# Patient Record
Sex: Male | Born: 1950 | Race: White | Hispanic: No | Marital: Married | Smoking: Former smoker
Health system: Southern US, Community
[De-identification: ages and names within clinical notes are randomized; demographics above are authoritative.]

## PROBLEM LIST (undated history)

## (undated) DIAGNOSIS — I499 Cardiac arrhythmia, unspecified: Secondary | ICD-10-CM

## (undated) DIAGNOSIS — Z923 Personal history of irradiation: Secondary | ICD-10-CM

## (undated) DIAGNOSIS — G20 Parkinson's disease: Secondary | ICD-10-CM

## (undated) DIAGNOSIS — F0390 Unspecified dementia without behavioral disturbance: Secondary | ICD-10-CM

## (undated) DIAGNOSIS — C801 Malignant (primary) neoplasm, unspecified: Secondary | ICD-10-CM

## (undated) HISTORY — PX: PEG PLACEMENT: SHX5437

## (undated) HISTORY — DX: Malignant (primary) neoplasm, unspecified: C80.1

## (undated) HISTORY — PX: BACK SURGERY: SHX140

---

## 2016-07-10 ENCOUNTER — Other Ambulatory Visit: Payer: Self-pay | Admitting: Hematology and Oncology

## 2016-07-11 ENCOUNTER — Telehealth: Payer: Self-pay | Admitting: *Deleted

## 2016-07-11 NOTE — Telephone Encounter (Signed)
  Oncology Nurse Navigator Documentation  Spoke with Mr. Riccitelli wife regarding referral from Dr. Conley Canal. She stated husband is pursuing chemo/RT in [place], [place], to begin later this month. Dr. Alvy Bimler informed.  Gayleen Orem, RN, BSN, [place] at [facility] [phone]   Navigator Location: [place] [phone]/07/17 1135) Navigator Encounter Type: Telephone (07/11/16 1135) Telephone: Jerilee Hoh Confirmation/Clarification (07/11/16 1135)             Barriers/Navigation Needs: Coordination of Care (07/11/16 1135)                          Time Spent with Patient: 15 (07/11/16 1135)

## 2016-07-16 ENCOUNTER — Telehealth: Payer: Self-pay | Admitting: *Deleted

## 2016-07-16 NOTE — Telephone Encounter (Signed)
Oncology Nurse Navigator Documentation  Spoke with Nathan Melton and his wife.  They have decided to investigate treatment at [facility] at this time rather than [place], [place], as previously indicated. I confirmed understanding of 9/20 9:00 NE, 9:30 consult with Dr. Isidore Moos; their understanding of [facility] location, explained arrival and registration procedures. I provided further information regarding my role as their Navigator, they understand I will join them at next week's appointment. I again provided my contact information, encouraged them to call me with questions prior to next week.  They voiced understanding.  Gayleen Orem, RN, BSN, [place] at [place] [phone]

## 2016-07-18 ENCOUNTER — Encounter: Payer: Self-pay | Admitting: Radiation Oncology

## 2016-07-18 NOTE — Progress Notes (Signed)
error 

## 2016-07-19 ENCOUNTER — Other Ambulatory Visit: Payer: Self-pay | Admitting: Hematology and Oncology

## 2016-07-19 DIAGNOSIS — C099 Malignant neoplasm of tonsil, unspecified: Secondary | ICD-10-CM

## 2016-07-19 NOTE — Progress Notes (Signed)
Radiation Oncology         [phone] ________________________________  Initial outpatient Consultation  Name: Nathan Melton MRN: [id number]  Date: 07/22/2016  DOB: 1951/10/29  [id number], DO  Fredricka Bonine, *   REFERRING PHYSICIAN: Fredricka Bonine, *  DIAGNOSIS:    ICD-9-CM ICD-10-CM   1. 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 male who presented with a four week history of a right neck mass with associated neck and ear pain.  Subsequently, the patient saw Dr. Conley Canal at [facility] who performed biopsy of right tonsil (tonsillectomy) on 06-25-16 revealed: small focus of squamous cell carcinoma, HPV type 16 positive   Pertinent imaging thus far includes CT of neck performed on 9.1.17 revealing a 3.3 cm right  glossotonsillar sulcus mass.  A right level 2A conglomerate of nodal tissue measured up to 4.7cm, as well as at least one, possibly more, suspicious right level 3 nodes (non bulky).  Same day PET revealed hypermetabolic activity in the right oropharyngeal mass and hypermetabolic lymph nodes posterior to the right sternocleidomastoid muscle concerning for nodal disease.No distant FDG avid metastatic disease in the chest, abdomen, pelvis.  Past/Anticipated interventions by medical oncology, if any: Dr. Alvy Bimler 07/22/16 at 12:00  Current Complaints / other details:   Dr. 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 I discussed this with his wife and recommended we do not proceed with surgery but rather set him up for chemoradiation therapy."  Swallowing issues, if any: uses pump for parkinson's meds, and has a peg to use prn (not yet needed) but denies new swallowing issues  r/t cancer  Weight Changes: denies  Pain status: denies  Tobacco history, if any: quit, distant  ETOH abuse, if any: no current ETOH     PREVIOUS RADIATION THERAPY: No  PAST MEDICAL HISTORY:  has a past medical history of Cancer ([place]); Dementia; and Parkinson's disease ([place]).    PAST SURGICAL HISTORY: Past Surgical History:  Procedure Laterality Date  . BACK SURGERY    . PEG PLACEMENT      FAMILY HISTORY: family history includes Cancer in his father, mother, and sister.  SOCIAL HISTORY:  reports that he quit smoking about 20 years ago. His smoking use included Cigarettes. He has a 30.00 pack-year smoking history. He has quit using smokeless tobacco. He reports that he does not drink alcohol.  ALLERGIES: Review of patient's allergies indicates no known allergies.  MEDICATIONS:  Current Outpatient Prescriptions  Medication Sig Dispense Refill  . ALPRAZolam (XANAX) 0.25 MG tablet Take one half to one whole tablet as needed for anxiety.    Marland Kitchen amitriptyline (ELAVIL) 75 MG tablet Take 75 mg by mouth.    . Carbidopa-Levodopa ER (SINEMET CR) 25-100 MG tablet controlled release Take by mouth. Take one tablet by mouth nightly    . docusate sodium (COLACE) 100 MG capsule Take 100 mg by mouth 2 (two) times daily.     Marland Kitchen donepezil (ARICEPT) 5 MG tablet Take 5 mg by mouth.    . Multiple Vitamins-Minerals (MULTIVITAMIN WITH MINERALS) tablet Take 1 tablet by mouth daily.    Marland Kitchen UNABLE TO FIND Med Name: carbidopa-levodopa pump continuous infusion for parkison's    .  CARBIDOPA-LEVODOPA EN by Enteral route.    . lactobacillus acidophilus (BACID) TABS tablet Take 2 tablets by mouth 3 (three) times daily.     No current facility-administered medications for this encounter.     REVIEW OF SYSTEMS:  Notable for that above.   PHYSICAL EXAM:  height is 5\' 9"  (1.753 m) and weight is 146 lb 4.8 oz (66.4 kg). His temperature is 98 F (36.7 C). His blood pressure is 125/88 and his pulse is 79. His  oxygen saturation is 100%.   General: Alert and oriented, in no acute distress HEENT: Head is normocephalic. Extraocular movements are intact. Edentulous. Oropharynx is notable for submucosal right tonsillar mass >2cm. Neck: Neck is notable for mass at angle of right mandible, and level 3 right neck fullness Heart: Regular in rate and rhythm   Chest: Clear to auscultation bilaterally, with no rhonchi, wheezes, or rales. Abdomen: Soft, nontender, nondistended, with no rigidity or guarding. PEG / pump in place  Extremities: No cyanosis or edema. Lymphatics: see Neck Exam Skin: No concerning lesions. Musculoskeletal: ambulatory, spasticity of extremities Neurologic: spasticity of extremities  Psychiatric: blunted affect, provides limited history  ECOG = 3  0 - Asymptomatic (Fully active, able to carry on all predisease activities without restriction)  1 - Symptomatic but completely ambulatory (Restricted in physically strenuous activity but ambulatory and able to carry out work of a light or sedentary nature. For example, light housework, office work)  2 - Symptomatic, <50% in bed during the day (Ambulatory and capable of all self care but unable to carry out any work activities. Up and about more than 50% of waking hours)  3 - Symptomatic, >50% in bed, but not bedbound (Capable of only limited self-care, confined to bed or chair 50% or more of waking hours)  4 - Bedbound (Completely disabled. Cannot carry on any self-care. Totally confined to bed or chair)  5 - Death   Eustace Pen MM, Creech RH, Tormey DC, et al. [phone]). "Toxicity and response criteria of the [facility]". [place] Oncol. 5 (6): 649-55   LABORATORY DATA:  Lab Results  Component Value Date   WBC 7.3 07/22/2016   HGB 13.6 07/22/2016   HCT 41.4 07/22/2016   MCV 92.1 07/22/2016   PLT 254 07/22/2016   CMP     Component Value Date/Time   NA 143 07/22/2016 0850   K 3.9 07/22/2016 0850   CO2 27  07/22/2016 0850   GLUCOSE 96 07/22/2016 0850   BUN 13.1 07/22/2016 0850   CREATININE 0.8 07/22/2016 0850   CALCIUM 9.4 07/22/2016 0850   PROT 7.4 07/22/2016 0850   ALBUMIN 3.4 (L) 07/22/2016 0850   AST 11 07/22/2016 0850   ALT <9 07/22/2016 0850   ALKPHOS 66 07/22/2016 0850   BILITOT 0.43 07/22/2016 0850     No results found for: TSH     RADIOGRAPHY: as above    IMPRESSION/PLAN:  This is a delightful patient with head and neck cancer. I do recommend radiotherapy for this patient.  We discussed the potential risks, benefits, and side effects of radiotherapy. We talked in detail about acute and late effects. We discussed that some of the most bothersome acute effects may be mucositis, dysgeusia, salivary changes, skin irritation, hair loss, dehydration, weight loss and fatigue. We talked about late effects which include but are not necessarily limited to dysphagia, hypothyroidism, nerve injury, spinal cord injury, xerostomia, trismus, and neck edema. No guarantees of treatment were given. A consent  form was signed and placed in the patient's medical record. The patient is enthusiastic about proceeding with treatment. I look forward to participating in the patient's care.    Simulation (treatment planning) will take place today  We also discussed that the treatment of head and neck cancer is a multidisciplinary process to maximize treatment outcomes and quality of life. For this reasons the following referrals have been or will be made:   Medical oncology to discuss chemotherapy    Nutritionist for nutrition support during and after treatment.   Speech language pathology for swallowing and/or speech therapy.   Social work for social support.    Physical therapy due to risk of lymphedema in neck and deconditioning.   Baseline labs including TSH. __________________________________________   Eppie Gibson, MD

## 2016-07-22 ENCOUNTER — Ambulatory Visit
Admission: RE | Admit: 2016-07-22 | Discharge: 2016-07-22 | Disposition: A | Discharge status: Home / Self Care | Payer: Medicare Other | Source: Ambulatory Visit | Attending: Radiation Oncology | Admitting: Radiation Oncology

## 2016-07-22 ENCOUNTER — Encounter: Payer: Self-pay | Admitting: Radiation Oncology

## 2016-07-22 ENCOUNTER — Other Ambulatory Visit (HOSPITAL_BASED_OUTPATIENT_CLINIC_OR_DEPARTMENT_OTHER): Payer: Medicare Other

## 2016-07-22 ENCOUNTER — Ambulatory Visit (HOSPITAL_BASED_OUTPATIENT_CLINIC_OR_DEPARTMENT_OTHER): Payer: Medicare Other | Admitting: Hematology and Oncology

## 2016-07-22 ENCOUNTER — Encounter: Payer: Self-pay | Admitting: *Deleted

## 2016-07-22 ENCOUNTER — Encounter: Payer: Self-pay | Admitting: Hematology and Oncology

## 2016-07-22 DIAGNOSIS — C090 Malignant neoplasm of tonsillar fossa: Secondary | ICD-10-CM | POA: Diagnosis present

## 2016-07-22 DIAGNOSIS — G20 Parkinson's disease: Secondary | ICD-10-CM | POA: Diagnosis not present

## 2016-07-22 DIAGNOSIS — Z510 Encounter for antineoplastic radiation therapy: Secondary | ICD-10-CM | POA: Diagnosis not present

## 2016-07-22 DIAGNOSIS — F0390 Unspecified dementia without behavioral disturbance: Secondary | ICD-10-CM | POA: Diagnosis not present

## 2016-07-22 DIAGNOSIS — C099 Malignant neoplasm of tonsil, unspecified: Secondary | ICD-10-CM

## 2016-07-22 DIAGNOSIS — F0280 Dementia in other diseases classified elsewhere without behavioral disturbance: Secondary | ICD-10-CM | POA: Insufficient documentation

## 2016-07-22 DIAGNOSIS — G20A1 Parkinson's disease without dyskinesia, without mention of fluctuations: Secondary | ICD-10-CM | POA: Insufficient documentation

## 2016-07-22 LAB — COMPREHENSIVE METABOLIC PANEL
ALK PHOS: 66 U/L (ref 40–150)
AST: 11 U/L (ref 5–34)
Albumin: 3.4 g/dL — ABNORMAL LOW (ref 3.5–5.0)
Anion Gap: 8 mEq/L (ref 3–11)
BUN: 13.1 mg/dL (ref 7.0–26.0)
CHLORIDE: 108 meq/L (ref 98–109)
CO2: 27 mEq/L (ref 22–29)
CREATININE: 0.8 mg/dL (ref 0.7–1.3)
Calcium: 9.4 mg/dL (ref 8.4–10.4)
EGFR: >90 mL/min/{1.73_m2} (ref >90)
GLUCOSE: 96 mg/dL (ref 70–140)
POTASSIUM: 3.9 meq/L (ref 3.5–5.1)
Sodium: 143 mEq/L (ref 136–145)
TOTAL PROTEIN: 7.4 g/dL (ref 6.4–8.3)
Total Bilirubin: 0.43 mg/dL (ref 0.20–1.20)

## 2016-07-22 LAB — CBC WITH DIFFERENTIAL/PLATELET
BASO%: 0.6 % (ref 0.0–2.0)
Basophils Absolute: 0 10*3/uL (ref 0.0–0.1)
EOS%: 2 % (ref 0.0–7.0)
Eosinophils Absolute: 0.1 10*3/uL (ref 0.0–0.5)
HCT: 41.4 % (ref 38.4–49.9)
HEMOGLOBIN: 13.6 g/dL (ref 13.0–17.1)
LYMPH#: 1.4 10*3/uL (ref 0.9–3.3)
LYMPH%: 18.9 % (ref 14.0–49.0)
MCH: 30.3 pg (ref 27.2–33.4)
MCHC: 32.9 g/dL (ref 32.0–36.0)
MCV: 92.1 fL (ref 79.3–98.0)
MONO#: 0.7 10*3/uL (ref 0.1–0.9)
MONO%: 9.2 % (ref 0.0–14.0)
NEUT%: 69.3 % (ref 39.0–75.0)
NEUTROS ABS: 5.1 10*3/uL (ref 1.5–6.5)
PLATELETS: 254 10*3/uL (ref 140–400)
RBC: 4.5 10*6/uL (ref 4.20–5.82)
RDW: 15.3 % — AB (ref 11.0–14.6)
WBC: 7.3 10*3/uL (ref 4.0–10.3)

## 2016-07-22 LAB — MAGNESIUM: Magnesium: 2.3 mg/dl (ref 1.5–2.5)

## 2016-07-22 MED ORDER — SODIUM CHLORIDE 0.9% FLUSH
10.0000 mL | Freq: Once | INTRAVENOUS | Status: AC
  Administered 2016-07-22: 10 mL via INTRAVENOUS

## 2016-07-22 NOTE — Progress Notes (Signed)
Head and Neck Cancer Simulation, IMRT treatment planning note   outpatient  Diagnosis: C09.0 Tonsillar Fossa Cancer   The patient was taken to the CT simulator and laid in the supine position on the table. An Aquaplast head and shoulder mask was custom fitted to the patient's anatomy. High-resolution CT axial imaging was obtained of the head and neck with contrast. I verified that the quality of the imaging is good for treatment planning. 1 Medically Necessary Treatment Device was fabricated and supervised by me: Aquaplast mask.   Treatment planning note I plan to treat the patient with IMRT. I plan to treat the patient's tumor and bilateral neck nodes. I plan to treat to a total dose of 70 Gray in 35  fractions. Dose calculation was ordered from dosimetry.  IMRT planning Note  IMRT is an important modality to deliver adequate dose to the patient's at risk tissues while sparing the patient's normal structures, including the: esophagus, parotid tissue, mandible, brain stem, spinal cord, oral cavity, brachial plexus.  This justifies the use of IMRT in the patient's treatment.    -----------------------------------  Eppie Gibson, MD

## 2016-07-22 NOTE — Assessment & Plan Note (Signed)
The patient has very advanced Parkinson's disease. He is on some form of continuous pump for medication delivery I would defer to his neurologist for further management

## 2016-07-22 NOTE — Progress Notes (Signed)
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 male is here because of newly diagnosed right tonsil cancer. Majority of the history is obtained through review of electronic records and collaborated history with his wife. According to the patient, the first initial presentation was due to palpable lump on the right side of his neck since May 2017. This patient also has background history of advanced Parkinson's disease, on a continuous infusion pump for medication administration through a feeding tube. His Parkinson's disease is also associated with dementia. He underwent extensive evaluation and was subsequently diagnosed with tonsil cancer. 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 Abnormal enhancing tissue abuts the right tongue base. Large enhancing and necrotic level 2A nodal conglomerate/mass measuring 4.7 x 2.7 x 3.3 cm, AP by  transverse by CC, corresponding to the patient's hypermetabolic lesion on contemporaneous PET imaging. The mass invades the right carotid space with abnormal tissue encasing and narrowing the cervical right internal carotid artery with abnormal enhancing tissue extending to the right internal carotid artery; the artery remains grossly patent.The right external carotid artery is encased and narrowed by the enhancing mass, however, appears to remain patent. The right internal jugular vein is compressed and occluded at the level of the mass. There is a long segment nonocclusive filling defect of the left internal jugular vein inferior to the mass concerning for nonocclusive thrombus within both benign and malignant thrombus in the differential. There is a central filling defect in the right EJ as well. Prominent rounded 9 mm right level 3 lymph node concerning for an additional site of metastatic disease as the node displays increased metabolic activity on contemporaneous PET imaging      07/05/2016 PET scan    Large hypermetabolic mass in the right neck as described above, concerning for malignancy. There is a large area of asymmetric and hypermetabolic soft tissue involving the right neck, which spans anteriorly involving the right palatine tonsil and posteriorly to the right sternocleidomastoid muscle, and measures approximately 6.3 x 4.3 cm. There is also asymmetric thickening and hypermetabolic activity within the right palatine tonsil. There are 2 adjacent lymph nodes, posterior to the right sternocleidomastoid muscle, which measure approximately 6 to 7 mm in size and demonstrate malignant range uptake of FDG. Ancillary head and neck CT findings: Intracranial atherosclerosis.Given asymmetric soft tissue thickening of the right palatine tonsil, this large mass could represent a primary lesion versus nodal disease. There are hypermetabolic lymph nodes posterior to the right sternocleidomastoid muscle concerning  for nodal disease. No distant FDG avid metastatic disease in the chest, abdomen, pelvis.       he denies any hearing deficit, difficulties with chewing food, swallowing difficulties, painful swallowing, changes in the quality of his voice or abnormal weight loss. He has persistent sedation from the right side of the neck causing discomfort but not pain.  MEDICAL HISTORY:  Past Medical History:  Diagnosis Date  . Cancer ([place])   . Dementia   . Parkinson's disease ([place])     SURGICAL HISTORY: Past Surgical History:  Procedure Laterality Date  . BACK SURGERY    . PEG PLACEMENT      SOCIAL HISTORY: Social History   Social History  . Marital status: Married    Spouse name: [place]  . Number of children: 4  . Years of education: N/A   Occupational History  . painter    Social History Main Topics  . Smoking status: Former Smoker    Packs/day: 2.00    Years: 15.00    Types: Cigarettes    Quit date: 11/05/1995  . Smokeless tobacco: Former Systems developer  . Alcohol use No  . Drug use: Unknown  . Sexual activity: Not on file   Other Topics Concern  . Not on file   Social History Narrative  . No narrative on file    FAMILY HISTORY: Family History  Problem Relation Age of Onset  . Cancer Mother     skin ca  . Cancer Father     lung ca  . Cancer Sister     breast ca    ALLERGIES:  has No Known Allergies.  MEDICATIONS:  Current Outpatient Prescriptions  Medication Sig Dispense Refill  . ALPRAZolam (XANAX) 0.25 MG tablet Take one half to one whole tablet as needed for anxiety.    Marland Kitchen amitriptyline (ELAVIL) 75 MG tablet Take 75 mg by mouth.    . CARBIDOPA-LEVODOPA EN by Enteral route.    . Carbidopa-Levodopa ER (SINEMET CR) 25-100 MG tablet controlled release Take by mouth. Take one tablet by mouth nightly    . docusate sodium (COLACE) 100 MG capsule Take 100 mg by mouth 2 (two) times daily.     Marland Kitchen donepezil (ARICEPT) 5 MG tablet Take 5 mg by mouth.    . lactobacillus  acidophilus (BACID) TABS tablet Take 2 tablets by mouth 3 (three) times daily.    . Multiple Vitamins-Minerals (MULTIVITAMIN WITH MINERALS) tablet Take 1 tablet by mouth daily.    Marland Kitchen UNABLE TO FIND Med Name: carbidopa-levodopa pump continuous infusion for parkison's     No current facility-administered medications for this visit.     REVIEW OF SYSTEMS:   Constitutional: Denies fevers, chills or abnormal night sweats Eyes: Denies blurriness of vision, double vision or watery eyes Ears, nose, mouth, throat, and face: Denies mucositis or sore throat Respiratory: Denies cough, dyspnea or wheezes Cardiovascular: Denies palpitation, chest discomfort or lower extremity swelling Gastrointestinal:  Denies nausea, heartburn or change in bowel habits Skin: Denies abnormal skin rashes All other systems were reviewed with the patient and are negative.  PHYSICAL EXAMINATION: ECOG PERFORMANCE STATUS: 2 - Symptomatic, <50% confined to bed  Vitals:   07/22/16 1122  BP: (!) 123/98  Pulse: 81  Resp: 20  Temp: 97.9 F (36.6 C)   Filed Weights   07/22/16 1122  Weight: 146 lb 12.8 oz (66.6 kg)    GENERAL:alert, no distress and comfortable. He has obvious movement disorder and cannot sit still SKIN: skin color, texture,  turgor are normal, no rashes or significant lesions EYES: normal, conjunctiva are pink and non-injected, sclera clear OROPHARYNX:no exudate, no erythema and lips, buccal mucosa, and tongue normal . I am not able to appreciate any abnormalities in his oropharynx NECK: supple, thyroid normal size, non-tender, without nodularity LYMPH:  He has significant palpable lymphadenopathy on the right side of the neck LUNGS: clear to auscultation and percussion with normal breathing effort HEART: regular rate & rhythm and no murmurs and no lower extremity edema ABDOMEN:abdomen soft, non-tender and normal bowel sounds. Feeding tube in situ Musculoskeletal:no cyanosis of digits and no clubbing .  He has an infusion pump delivering medicine through a port in his abdominal wall PSYCH: alert & oriented with difficulties with speech disturbances NEURO: He has significant movement disorder and is not able to sit still.  LABORATORY DATA:  I have reviewed the data as listed Lab Results  Component Value Date   WBC 7.3 07/22/2016   HGB 13.6 07/22/2016   HCT 41.4 07/22/2016   MCV 92.1 07/22/2016   PLT 254 07/22/2016   Lab Results  Component Value Date   NA 143 07/22/2016   K 3.9 07/22/2016   CO2 27 07/22/2016    RADIOGRAPHIC STUDIES: Outside reports noted  ASSESSMENT:  Newly diagnosed squamous cell carcinoma of the Head & Neck, HPV Positive  PLAN:  Carcinoma of tonsillar fossa ([place]) I have a long discussion with the patient and his wife. The patient has significant comorbidities related to his Parkinson's disease. PET/CT scan showed locally advanced disease. In another different patient, I would have consider offering him concurrent systemic treatment. However, given his significant other comorbidities, I am concerned that the addition of concurrent chemotherapy may adversely effect his radiation treatment. After significant discussion about the risks, benefits, side effects of concurrent chemotherapy, his wife agreed that we should not proceed with concurrent treatment. I plan to see him back in 3 weeks for supportive care only.  Parkinson's disease [facility]) The patient has very advanced Parkinson's disease. He is on some form of continuous pump for medication delivery I would defer to his neurologist for further management  Dementia due to Parkinson's disease without behavioral disturbance [facility]) The patient is currently on treatment for this. According to his wife, she has seen some improvement of his dementia. He has occasional late day confusion episodes but without behavioral disturbances. I will continue to defer to his neurologist for further management    All  questions were answered. The patient knows to call the clinic with any problems, questions or concerns. I spent 40 minutes counseling the patient face to face. The total time spent in the appointment was 60 minutes and more than 50% was on counseling.     [facility], [place], MD 07/22/16 12:22 PM

## 2016-07-22 NOTE — Assessment & Plan Note (Signed)
The patient is currently on treatment for this. According to his wife, she has seen some improvement of his dementia. He has occasional late day confusion episodes but without behavioral disturbances. I will continue to defer to his neurologist for further management

## 2016-07-22 NOTE — Progress Notes (Addendum)
Head and Neck Cancer Location of Tumor / Histology:  06/25/16 OROPHARYNX, RIGHT TONSIL, TONSILLECTOMY: Small focus of invasive squamous cell carcinoma (see note).  Note: the HPV PCR test has been ordered. However the current material is very small and may not be sufficient. ADDENDUM Date Ordered: 9/1/2017Date Reported: 07/08/2016 Addendum Diagnosis HPV testing performed on block [place]:[id number] A1 (~5% tumor).  TestResults Reference Values HPVHR type 16, PCRPOSITIVENegative HPVHR type 18, PCRNegativeNegative HPV other HR types, PCR NegativeNegative (Negative for one of the other High Risk HPV types:31, 33, 35, 39, 45, 51, 52, 56, 58, 59, 66 and 68)  Patient presented  months ago with symptoms of: On 06/19/16 He presented to Dr. Conley Canal with a 4 week history of a Right Neck Mass. He reported the mass is painful and has some right sided ear pain and warmth.   Biopsies of  Revealed: Oropharynx, Right Tonsil revealed invasive squamous cell carcinoma.   Nutrition Status Yes No Comments  Weight changes? []  [x]    Swallowing concerns? []  [x]    PEG? [x]  []  He has a feeding tube which is also used to [employer] medicine.    Referrals Yes No Comments  Social Work? []  [x]    Dentistry? []  [x]    Swallowing therapy? []  [x]    Nutrition? []  [x]    Med/Onc? []  []  Dr. Alvy Bimler 9/18   Safety Issues Yes No Comments  Prior radiation? []  [x]    Pacemaker/ICD? []  [x]    Possible current pregnancy? []  [x]    Is the patient on methotrexate? []  [x]     Tobacco/Marijuana/Snuff/ETOH use: He quit smoking about 20 years ago. He reports he smoked 2 packs daily for 15 years.   Past/Anticipated interventions by otolaryngology, if any:  Dr. Conley Canal 06/25/16 PROCEDURES: 1. Suspension microdirect laryngoscopy. 2. Biopsy of right tonsil.  SURGEON: Christopher A. Conley Canal, M.D. ASSISTANT  SURGEON: Heath Lark, M.D.       Past/Anticipated interventions by medical oncology, if any: Dr. 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 I discussed this with his wife and recommended we do not proceed with surgery but rather set him up for chemoradiation therapy.  BP 125/88   Pulse 79   Temp 98 F (36.7 C)   Ht 5\' 9"  (1.753 m)   Wt 146 lb 4.8 oz (66.4 kg)   SpO2 100% Comment: room air  BMI 21.60 kg/m    Wt Readings from Last 3 Encounters:  07/22/16 146 lb 4.8 oz (66.4 kg)       No chief complaint on file.

## 2016-07-22 NOTE — Progress Notes (Addendum)
Does patient have an allergy to IV contrast dye?: No   Has patient ever received premedication for IV contrast dye?: No  Does patient take metformin?: No  If patient does take metformin when was the last dose: NA  Date of lab work: 07/22/16 BUN: 13.1 CR: 0.8  IV site: Left Anterior forearm.

## 2016-07-22 NOTE — Assessment & Plan Note (Signed)
I have a long discussion with the patient and his wife. The patient has significant comorbidities related to his Parkinson's disease. PET/CT scan showed locally advanced disease. In another different patient, I would have consider offering him concurrent systemic treatment. However, given his significant other comorbidities, I am concerned that the addition of concurrent chemotherapy may adversely effect his radiation treatment. After significant discussion about the risks, benefits, side effects of concurrent chemotherapy, his wife agreed that we should not proceed with concurrent treatment. I plan to see him back in 3 weeks for supportive care only.

## 2016-07-23 ENCOUNTER — Telehealth: Payer: Self-pay | Admitting: *Deleted

## 2016-07-23 ENCOUNTER — Other Ambulatory Visit: Payer: Self-pay | Admitting: Radiation Oncology

## 2016-07-23 DIAGNOSIS — C090 Malignant neoplasm of tonsillar fossa: Secondary | ICD-10-CM

## 2016-07-23 DIAGNOSIS — R5381 Other malaise: Secondary | ICD-10-CM

## 2016-07-23 NOTE — Telephone Encounter (Signed)
Oncology Nurse Navigator Documentation  Subsequent to this morning's call from [name] and her request that afternoon RT appointments be scheduled for the morning, she called to re-request afternoon appointments to accommodate her work schedule.  I rescheduled appts with RTT Melissa to the afternoon.  Called [name] to inform.  She voiced understanding and appreciation.  Nathan Orem, RN, BSN, [place] at [place] [phone]

## 2016-07-24 ENCOUNTER — Ambulatory Visit: Payer: Medicare Other

## 2016-07-24 ENCOUNTER — Ambulatory Visit
Admission: RE | Admit: 2016-07-24 | Discharge: 2016-07-24 | Disposition: A | Discharge status: Home / Self Care | Payer: Medicare Other | Source: Ambulatory Visit | Attending: Radiation Oncology | Admitting: Radiation Oncology

## 2016-07-24 HISTORY — DX: Parkinson's disease: G20

## 2016-07-24 HISTORY — DX: Unspecified dementia, unspecified severity, without behavioral disturbance, psychotic disturbance, mood disturbance, and anxiety: F03.90

## 2016-07-25 NOTE — Progress Notes (Signed)
Oncology Nurse Navigator Documentation  Met with Nathan Melton and his wife during initial consult with Dr. Alvy Bimler.    They voiced understanding of her recommendation chemotherapy not be included in his treatment plan.  We discussed his attendance at next Tuesday morning's H&N MDC, I provided them a 0900 arrival, explained registration and arrival procedures. I encouraged them to call with questions/concerns, they verbalized understanding.  Gayleen Orem, RN, BSN, [place] at [place] [phone]

## 2016-07-25 NOTE — Progress Notes (Signed)
Oncology Nurse Navigator Documentation  Met with Nathan Melton and his wife during initial consult with Dr. Isidore Moos.    1. Further introduced myself as his Navigator, explained my role as a member of the Care Team.   2. Provided New Patient Information packet, discussed contents:  Contact information for physician(s), myself, other members of the Care Team.  Advance Directive information ([place] blue pamphlet with LCSW contact info)  Fall Prevention Patient [name]  [place] campus map with highlight of [place] 3. Provided introductory explanation of radiation treatment including SIM planning and purpose of Aquaplast head and shoulder mask, showed them example.   4. I accompanied him to CT SIM, provided support during session. 5. Provided tour of LINAC 2, explained treatment and arrival procedures. 6. I encouraged them to contact me with questions/concerns as treatments/procedures begin.  They verbalized understanding of information provided.    Gayleen Orem, RN, BSN, [place] at [place] [phone]

## 2016-07-25 NOTE — Telephone Encounter (Signed)
A user error has taken place: encounter opened in error, closed for administrative reasons.

## 2016-07-29 ENCOUNTER — Telehealth: Payer: Self-pay | Admitting: *Deleted

## 2016-07-29 DIAGNOSIS — Z510 Encounter for antineoplastic radiation therapy: Secondary | ICD-10-CM | POA: Diagnosis not present

## 2016-07-29 NOTE — Telephone Encounter (Signed)
Oncology Nurse Navigator Documentation  LVM for Mr. Borre reminding him of 0900 arrival and registration procedure for tomorrow morning's H&N MDC.  Requested return call to confirm message receipt.  Gayleen Orem, RN, BSN, [place] at [place] [phone]

## 2016-07-30 ENCOUNTER — Ambulatory Visit: Payer: Medicare Other | Attending: Radiation Oncology

## 2016-07-30 ENCOUNTER — Ambulatory Visit: Payer: Medicare Other | Admitting: Nutrition

## 2016-07-30 ENCOUNTER — Ambulatory Visit
Admission: RE | Admit: 2016-07-30 | Discharge: 2016-07-30 | Disposition: A | Discharge status: Home / Self Care | Payer: Medicare Other | Source: Ambulatory Visit | Attending: Radiation Oncology | Admitting: Radiation Oncology

## 2016-07-30 ENCOUNTER — Encounter: Payer: Self-pay | Admitting: Radiation Oncology

## 2016-07-30 ENCOUNTER — Encounter: Payer: Self-pay | Admitting: *Deleted

## 2016-07-30 ENCOUNTER — Ambulatory Visit: Payer: Medicare Other | Admitting: Physical Therapy

## 2016-07-30 VITALS — BP 120/83 | HR 72 | Temp 98.0°F | Ht 69.0 in | Wt 146.3 lb

## 2016-07-30 DIAGNOSIS — R41841 Cognitive communication deficit: Secondary | ICD-10-CM | POA: Diagnosis present

## 2016-07-30 DIAGNOSIS — R29898 Other symptoms and signs involving the musculoskeletal system: Secondary | ICD-10-CM | POA: Diagnosis present

## 2016-07-30 DIAGNOSIS — R471 Dysarthria and anarthria: Secondary | ICD-10-CM | POA: Diagnosis present

## 2016-07-30 DIAGNOSIS — R293 Abnormal posture: Secondary | ICD-10-CM | POA: Diagnosis present

## 2016-07-30 DIAGNOSIS — C090 Malignant neoplasm of tonsillar fossa: Secondary | ICD-10-CM

## 2016-07-30 DIAGNOSIS — R1310 Dysphagia, unspecified: Secondary | ICD-10-CM

## 2016-07-30 DIAGNOSIS — Z510 Encounter for antineoplastic radiation therapy: Secondary | ICD-10-CM | POA: Diagnosis not present

## 2016-07-30 MED ORDER — SONAFINE EX EMUL
1.0000 "application " | Freq: Once | CUTANEOUS | Status: AC
  Administered 2016-07-30: 1 via TOPICAL

## 2016-07-30 NOTE — Progress Notes (Signed)
Oncology Nurse Navigator Documentation  Met with Mr. Degnan during H&N [place].  He was accompanied by his wife, Leda Gauze.  Arrived him to Nursing, provided verbal and written overview of [place], the clinicians who will be seeing him, encouraged him to ask questions during his time with them.  He was seen by Nutrition, SLP, PT, SW and [place].  Spoke with him at end of [facility], addressed questions.  Provided him 60 cc syringe for flushing PEG.  Syringe attached securely to PEG port.  Guided them to flush PEG daily with 40-60 cc water.  Forwarded gas card provided by [name], LCSW.  Provided wife with letter for employer indicating her attendance at [employer].  Addressed Mr. Liller questions re RT treatment which starts tomorrow.  They understand I will be joining them. They understand I can be contacted with needs/concerns.  Gayleen Orem, RN, BSN, [place] at [place] [phone]

## 2016-07-30 NOTE — Patient Instructions (Addendum)
SWALLOWING EXERCISES Do these 6 of the 7 days per week until 6 months after your last day of radiation, then 3 times per week afterwards  1. Effortful Swallows - Press your tongue against the roof of your mouth for 3 seconds, then squeeze          the muscles in your neck while you swallow your saliva or a sip of water - Repeat 20 times, 2-3 times a day, and use whenever you eat or drink  2. Masako Swallow - swallow with your tongue sticking out - Stick tongue out past your teeth and gently bite tongue with your teeth - Swallow, while holding your tongue with your teeth - Repeat 20 times, 2-3 times a day *use a wet spoon if your mouth gets dry*  3. Pitch Raise - Repeat "he", once per second in as high of a pitch as you can - Repeat 20 times, 2-3 times a day  4. Mendelsohn Maneuver - "half swallow" exercise - Start to swallow, and keep your Adam's apple up by squeezing hard with the            muscles of the throat - Hold the squeeze for 5-7 seconds and then relax - Repeat 20 times, 2-3 times a day *use a wet spoon if your mouth gets dry*  5. Breath Hold - Say "HUH!" loudly, then hold your breath for 3 seconds at your voice box - Repeat 20 times, 2-3 times a day  6. Chin pushback - Open your mouth  - Place your fist UNDER your chin near your neck, and push back with your fist for 5 seconds - Repeat 10 times, 2-3 times a day        7.  Open mouth swallow  - Open your mouth and swallow your saliva  - Repeat 10 times, 2-3 times a day

## 2016-07-30 NOTE — Progress Notes (Signed)
Financial Counselor--talked with patient and spouse--they are going to bring in their income verification to see if they qualify for [address] [place]

## 2016-07-30 NOTE — Addendum Note (Signed)
Addended by: Garald Balding B on: 07/30/2016 05:00 PM   Modules accepted: Orders

## 2016-07-30 NOTE — Progress Notes (Unsigned)
Head & Neck Multidisciplinary Clinic Clinical Social Work  Clinical Social Work met with patient/family at head & neck multidisciplinary clinic to offer support and assess for psychosocial needs.  Nathan Melton was accompanied by his spouse,  Nathan Melton.  The patient and spouse live in [place], [place] and it takes them about 1 hour and 20 minutes to transport to the cancer center.  CSW and patient/spouse discussed their current stressors and common stressors related to cancer treatment.  CSW provided active listening and explored coping skills for both patient and caregiver.  Clinical Social Work briefly discussed [place] support programs/services.  Clinical Social Work encouraged patient to call with any additional questions or concerns.   [name], MSW, LCSW, OSW-C Clinical Social Worker [facility] [phone]

## 2016-07-30 NOTE — Therapy (Signed)
[facility] [address] [place], [place], [address] Phone: [phone]   Fax:  [phone]  Speech Language Pathology Evaluation  Patient Details  Name: Nathan Melton MRN: [id number] Date of Birth: August 30, 1951 Referring Provider: Eppie Gibson MD  Encounter Date: 07/30/2016      End of Session - 07/30/16 1642    Visit Number 1   Number of Visits 3   Date for SLP Re-Evaluation 10/11/16   SLP Start Time 1100   SLP Stop Time  1140   SLP Time Calculation (min) 40 min   Activity Tolerance Other (comment)  significant bradykinesias during HEP education      Past Medical History:  Diagnosis Date  . Cancer ([facility])   . Dementia   . Parkinson's disease [facility])     Past Surgical History:  Procedure Laterality Date  . BACK SURGERY    . PEG PLACEMENT      There were no vitals filed for this visit.      Subjective Assessment - 07/30/16 1110    Subjective Pt with Parkinson's Disease s/p 18 years diagnosis.   Patient is accompained by: --  wife   Currently in Pain? No/denies            SLP Evaluation [facility] - 07/30/16 1110      SLP Visit Information   SLP Received On 07/30/16   Referring Provider Eppie Gibson MD   Medical Diagnosis Rt tonsillar ISCC     General Information   HPI Pt with what appears to be advanced PD, at least mod bradykinesias with enteral pump for PD meds, with memory deficits. He has had a PEG placed but eats soft diet with thin liquids at this time. No overt s/s aspriation PNA reported by wife, none observed today by SLP. Pt with sub-WNL speech volume. Wife reports SLP at [facility] provided a "breathing device" (SLP assumes it is IMST/EMST device). Pt is not using presently.     Prior Functional Status   Cognitive/Linguistic Baseline Baseline deficits   Baseline deficit details Memory    Lives With Spouse   Available Support Family   Vocation On disability     Cognition   Overall  Cognitive Status Impaired/Different from baseline   Area of Impairment Memory     Auditory Comprehension   Overall Auditory Comprehension Appears within functional limits for tasks assessed     Verbal Expression   Overall Verbal Expression Appears within functional limits for tasks assessed     Oral Motor/Sensory Function   Overall Oral Motor/Sensory Function Impaired   Labial ROM Reduced right;Reduced left   Labial Symmetry Within Functional Limits   Labial Strength Reduced   Labial Coordination Reduced   Lingual ROM Other (Comment)  difficult to assess   Lingual Strength Reduced  left affected more than rt   Lingual Coordination Reduced     Motor Speech   Overall Motor Speech Impaired   Respiration Impaired   Phonation Low vocal intensity   Intelligibility Intelligibility reduced      Pt currently tolerates soft diet and thin liquids. Pt is endentulous. Cough and throat clear on command are weak. POs: Pt ate applesauce and drank H2O without overt s/s aspiration. Thyroid elevation appeared WNL but this was difficult to visualize given pt's bradykinesia. Swallows appeared timely but again visualization was difficult given pt's bradykinesia. Pt's swallow deemed WFL at this time.   Because data states the risk for dysphagia during and after radiation treatment is high  due to undergoing radiation tx, SLP taught pt about the possibility of reduced/limited ability for PO intake during rad tx. SLP encouraged pt to continue swallowing POs as far into rad tx as possible, even ingesting POs and/or completing HEP shortly after administration of pain meds.   SLP educated pt re: changes to swallowing musculature after rad tx, and why adherence to dysphagia HEP provided today and PO consumption was necessary to inhibit muscular disuse atrophy and to reduce muscle fibrosis following rad tx. Pt demonstrated understanding of these things to SLP. Further education was provided re: "breathing device"  provided pt at [facility] - wife asked SLP if pt should be completing this and SLP suggested pt /wife focus on dysphagia exercises for the next 4-6 weeks and add in the "Breathing device" after that time.   SLP then developed a HEP for pt and pt was instructed how to perform exercises involving lingual, vocal, and pharyngeal strengthening. SLP performed each exercise and pt return demonstrated each exercise. SLP ensured pt performance was correct prior to moving on to next exercise. Pt had difficulty with Mendelsohn and Masako, as well as breath hold/laryngeal adduction. Pt was instructed to complete this HEP 2-3 times a day, 6-7 days/week until 6 months after their last rad tx, then x2-3 a week after that. Wife was present during entire eval and stated she was completely comfortable with pt A with HEP. She return demo'd each exercise as well, appropriately.                     SLP Education - 07/30/16 1641    Education provided Yes   Education Details HEP, late effects head/neck radiation on swallowing abilty   Person(s) Educated Patient;Spouse   Methods Explanation;Demonstration;Verbal cues;Handout   Comprehension Verbalized understanding;Returned demonstration;Verbal cues required;Need further instruction  wife stated she was comfortable helping pt with HEP          SLP Short Term Goals - 07/30/16 1648      SLP SHORT TERM GOAL #1   Title pt will complete HEP with usual min A   Time 1   Period --  visit   Status New     SLP SHORT TERM GOAL #2   Title pt/family will tell SLP 3 overt s/s aspiration PNA   Time 1   Period --  visits   Status New          SLP Long Term Goals - 07/30/16 1649      SLP LONG TERM GOAL #1   Title pt will complete HEP with occasional min A   Time 3   Period --  visits (visit number 3)   Status New     SLP LONG TERM GOAL #2   Title pt/family will tell SLP how keeping a food journal can A pt in returning to WFL/WNL diet   Time 3    Period --  visits   Status New          Plan - 07/30/16 1643    Clinical Impression Statement Pt without overt s/s aspiration PNA on a soft diet/thin liquids due to endentulous. No overt s/s aspiration with bites applesauce and sips H2O today during eval. Pt will likely have difficulty completing HEP on his own due to memory defiicts- wife stated she is completely comfortable helping pt with HEP, and demonstrated each exercise for SLP correctly. Pt will need to be observed closely for overt s/s aspiration pneumonia (PNA).  He would beneift  from skilled ST addressing safety with POs as well as completion of HEP during and following his radiation treatment.   Speech Therapy Frequency --  approx once a month   Duration --  3 visits in no more than 90 days   Treatment/Interventions Aspiration precaution training;Pharyngeal strengthening exercises;Diet toleration management by SLP;Compensatory techniques;Internal/external aids;SLP instruction and feedback;Patient/family education;Trials of upgraded texture/liquids  any or all may be used in ST sessions   Potential to Achieve Goals Fair   Potential Considerations Severity of impairments;Previous level of function;Ability to learn/carryover information   SLP Home Exercise Plan provided today   Consulted and Agree with Plan of Care Patient;Family member/caregiver   Family Member Consulted wife      Patient will benefit from skilled therapeutic intervention in order to improve the following deficits and impairments:   Dysphagia  Dysarthria and anarthria  Cognitive communication deficit      G-Codes - 27-Aug-2016 1653    Functional Assessment Tool Used noms - 6   Functional Limitations Swallowing   Swallow Current Status [id number]) At least 1 percent but less than 20 percent impaired, limited or restricted   Swallow Goal Status [id number]) At least 20 percent but less than 40 percent impaired, limited or restricted      Problem List Patient  Active Problem List   Diagnosis Date Noted  . 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 Parkinson's disease ([place]) 07/22/2016  . Dementia due to Parkinson's disease without behavioral disturbance ([place]) 07/22/2016    [facility] ,[place], [place]  08/27/2016, 4:54 PM  [facility] [address] [place], [place], [address] Phone: [phone]   Fax:  [phone]  Name: Nathan Melton MRN: [id number] Date of Birth: 10/30/1951

## 2016-07-30 NOTE — Progress Notes (Signed)
Patient was seen and head and neck clinic.  65 year old male diagnosed with tonsil cancer to receive IM RT.  He is a patient of Dr. Isidore Moos.  Past medical history includes dementia and Parkinson's disease.  Medications include Xanax, Colace, multivitamin.  Labs were reviewed.  Height: 69 inches. Weight: 146.3 pounds. Usual body weight: 160 pounds. BMI: 21.6.  Patient was seen with wife who provides a lot of his care. Patient is status post feeding tube placement at [facility]. He is currently not using his feeding tube. Patient is edentulous but tolerates some soft foods. Patient's wife is confused regarding feeding tube for feeding and tube for medication.  Nutrition diagnosis:  Unintended weight loss related to tonsil cancer as evidenced by 9% weight loss from usual body weight.  Intervention: Educated patient to consume high-calorie high-protein foods as tolerated in small, frequent meals and snacks. Educated patient on high-protein foods and provided a fact sheet on increasing calories and protein. Recommended patient begin oral nutrition supplements and provided samples and coupons. Brief education provided on feeding tube.  Patient has a PEG for feeding with a second tube in his jejunostomy for medications. Educated patient's wife on importance of flushing PEG with water on a daily basis.  Nurse navigator to follow-up for further education as needed. Questions were answered.  Teach back method used.  Contact information provided.  Monitoring, evaluation, goals:  Patient will tolerate oral intake plus enteral nutrition to minimize weight loss.  Next visit: To be scheduled weekly with radiation therapy.  **Disclaimer: This note was dictated with voice recognition software. Similar sounding words can inadvertently be transcribed and this note may contain transcription errors which may not have been corrected upon publication of note.**

## 2016-07-30 NOTE — Therapy (Signed)
[place], [place], [address] Phone: [phone]   Fax:  [phone]  Physical Therapy Evaluation  Patient Details  Name: Nathan Melton MRN: [id number] Date of Birth: 09/08/1951 Referring Provider: Dr. Eppie Gibson  Encounter Date: 07/30/2016      PT End of Session - 07/30/16 1145    Visit Number 1   Number of Visits 1   PT Start Time [id number]   PT Stop Time 1040   PT Time Calculation (min) 25 min   Activity Tolerance Patient tolerated treatment well   Behavior During Therapy [facility] for tasks assessed/performed      Past Medical History:  Diagnosis Date  . Cancer ([place])   . Dementia   . Parkinson's disease [facility])     Past Surgical History:  Procedure Laterality Date  . BACK SURGERY    . PEG PLACEMENT      There were no vitals filed for this visit.       Subjective Assessment - 07/30/16 1133    Subjective Per wife, Parkinson's was diagnosed 18 years ago.   Patient is accompained by: Family member  wife   Pertinent History Diagnosed with right tonsil invasive squamous cell carcinoma, P16 positive and expected to have XRT treatment only.  Parkinson's Disease and is on a pump with medication for that; wife says it's the type that makes his left side "lock up."     Patient Stated Goals get info from all head & neck clinic providers   Currently in Pain? Yes   Pain Score 8    Pain Location Throat   Pain Descriptors / Indicators Sore   Aggravating Factors  nothing   Pain Relieving Factors nothing            [facility] PT Assessment - 07/30/16 1136      Assessment   Medical Diagnosis right tonsil invasive squamous cell carcinoma   Referring Provider Dr. Eppie Gibson     Precautions   Precautions Other (comment)   Precaution Comments cancer precautions; Parkinson's  possible memory problems     Restrictions   Weight Bearing Restrictions No     Balance Screen   Has the patient fallen in  the past 6 months No   Has the patient had a decrease in activity level because of a fear of falling?  No   Is the patient reluctant to leave their home because of a fear of falling?  No     Home Environment   Living Environment Private residence   Living Arrangements Spouse/significant other   Type of [place] One level     Prior Function   Level of Independence Independent with household mobility without device;Independent with community mobility without device   Leisure pedals a stationary bike 5 minutes daily     Cognition   Area of Impairment Memory     Observation/Other Assessments   Observations adult male with variable large tremors     Functional Tests   Functional tests Sit to Stand     Sit to Stand   Comments 8 times in 30 seconds, below average for age     Posture/Postural Control   Posture/Postural Control Postural limitations   Postural Limitations Forward head;Rounded Shoulders  head tilts to right     ROM / Strength   AROM / PROM / Strength AROM     AROM   Overall AROM Comments neck AROM shows 25% loss in flexion  and extension, 50% loss in rotation bilat., and slight loss in sidebend bilat.; shoulders slightly limited but [facility]     Palpation   Palpation comment fullness/firmness at right neck  right neck is visibly larger than left     Ambulation/Gait   Ambulation/Gait Yes   Ambulation/Gait Assistance 6: Modified independent (Device/Increase time)  appears slightly unsteady           LYMPHEDEMA/ONCOLOGY QUESTIONNAIRE - 07/30/16 1142      Type   Cancer Type right tonsil squamous cell     Treatment   Active Radiation Treatment Yes   Date --  will start soon     Lymphedema Assessments   Lymphedema Assessments Head and Neck     Head and Neck   4 cm superior to sternal notch around neck 37.2 cm   6 cm superior to sternal notch around neck 36.1 cm   8 cm superior to sternal notch around neck 37.2 cm                         PT Education - 07/30/16 1144    Education provided Yes   Education Details neck ROM, posture, stationary biking, [employer] article on staying active, PT info, lymphedema info   Person(s) Educated Patient;Spouse   Methods Explanation;Handout   Comprehension Verbalized understanding                 Head and Neck Clinic Goals - 07/30/16 1150      Patient will be able to verbalize understanding of a home exercise program for cervical range of motion, posture, and walking.    Status Achieved     Patient will be able to verbalize understanding of proper sitting and standing posture.    Status Achieved     Patient will be able to verbalize understanding of lymphedema risk and availability of treatment for this condition.    Status Achieved           Plan - 07/30/16 1145    Clinical Impression Statement Patient with right tonsilar cancer with visible fullness at right neck expected to be treated with radiation therapy; 18 year h/o Parkinson's Disease and on a medication pump for that.  Possible early Alzheimer's.     Rehab Potential Good   PT Frequency One time visit   PT Treatment/Interventions Patient/family education   PT Next Visit Plan None at this time; will need therapy if lymphedema develops   PT Home Exercise Plan neck ROM, posture, progressive stationary biking   Consulted and Agree with Plan of Care Patient;Family member/caregiver      Patient will benefit from skilled therapeutic intervention in order to improve the following deficits and impairments:  Decreased knowledge of precautions, Postural dysfunction, Decreased range of motion  Visit Diagnosis: Abnormal posture - Plan: PT plan of care cert/re-cert  Other symptoms and signs involving the musculoskeletal system - Plan: PT plan of care cert/re-cert      G-Codes - [id number]    Functional Assessment Tool Used clinical judgement   Functional  Limitation Changing and maintaining body position   Changing and Maintaining Body Position Current Status [id number]) At least 40 percent but less than 60 percent impaired, limited or restricted   Changing and Maintaining Body Position Goal Status [id number]) At least 40 percent but less than 60 percent impaired, limited or restricted   Changing and Maintaining Body Position Discharge Status [id number]) At least 40 percent but less than 60 percent  impaired, limited or restricted       Problem List Patient Active Problem List   Diagnosis Date Noted  . Carcinoma of tonsillar fossa ([place]) 07/22/2016  . Parkinson's disease ([place]) 07/22/2016  . Dementia due to Parkinson's disease without behavioral disturbance ([place]) 07/22/2016    Nika Yazzie 07/30/2016, 11:53 AM  [place], [place], [address] Phone: [phone]   Fax:  [phone]  Name: Diallo Pawlus MRN: [id number] Date of Birth: Aug 14, 1951  Serafina Royals, PT 07/30/16 11:53 AM

## 2016-07-30 NOTE — Progress Notes (Signed)

## 2016-07-31 ENCOUNTER — Ambulatory Visit
Admission: RE | Admit: 2016-07-31 | Discharge: 2016-07-31 | Disposition: A | Discharge status: Home / Self Care | Payer: Medicare Other | Source: Ambulatory Visit | Attending: Radiation Oncology | Admitting: Radiation Oncology

## 2016-07-31 ENCOUNTER — Encounter: Payer: Self-pay | Admitting: Radiation Oncology

## 2016-07-31 ENCOUNTER — Ambulatory Visit: Payer: Medicare Other

## 2016-07-31 DIAGNOSIS — Z510 Encounter for antineoplastic radiation therapy: Secondary | ICD-10-CM | POA: Diagnosis not present

## 2016-07-31 DIAGNOSIS — C090 Malignant neoplasm of tonsillar fossa: Secondary | ICD-10-CM

## 2016-07-31 NOTE — Progress Notes (Signed)
IMRT Device Note    ICD-9-CM ICD-10-CM   1. Carcinoma of tonsillar fossa (HCC) 146.1 C09.0    The IMRT treatment devices have been approved. The code is [phone].  -----------------------------------  Eppie Gibson, MD

## 2016-08-01 ENCOUNTER — Encounter: Payer: Self-pay | Admitting: *Deleted

## 2016-08-01 ENCOUNTER — Encounter: Payer: Medicare Other | Admitting: Physical Therapy

## 2016-08-01 ENCOUNTER — Ambulatory Visit
Admission: RE | Admit: 2016-08-01 | Discharge: 2016-08-01 | Disposition: A | Discharge status: Home / Self Care | Payer: Medicare Other | Source: Ambulatory Visit | Attending: Radiation Oncology | Admitting: Radiation Oncology

## 2016-08-01 DIAGNOSIS — Z510 Encounter for antineoplastic radiation therapy: Secondary | ICD-10-CM | POA: Diagnosis not present

## 2016-08-01 NOTE — Progress Notes (Signed)
  Oncology Nurse Navigator Documentation  Met with Nathan Melton and his wife during his second RT. He indicated he is managing treatment without difficulty. At wife's request, I provided the following letters:  Verification of her accompanying him to 9/27 and today's RT.  Documentation of his diagnosis, RT plan, schedule of appointments through EOT. She stated she will need "a doctor's excuse" each day she brings him to treatment. They understand I can be contacted with needs/concerns.  Navigator Location: [facility] (08/01/16 1340) Navigator Encounter Type: Treatment;Letter/Fax/Email (08/01/16 1340)             Treatment Phase: Treatment (08/01/16 1340)                            Time Spent with Patient: 45 (08/01/16 1340)

## 2016-08-02 ENCOUNTER — Ambulatory Visit
Admission: RE | Admit: 2016-08-02 | Discharge: 2016-08-02 | Disposition: A | Discharge status: Home / Self Care | Payer: Medicare Other | Source: Ambulatory Visit | Attending: Radiation Oncology | Admitting: Radiation Oncology

## 2016-08-02 DIAGNOSIS — Z510 Encounter for antineoplastic radiation therapy: Secondary | ICD-10-CM | POA: Diagnosis not present

## 2016-08-05 ENCOUNTER — Ambulatory Visit
Admission: RE | Admit: 2016-08-05 | Discharge: 2016-08-05 | Disposition: A | Discharge status: Home / Self Care | Payer: Medicare Other | Source: Ambulatory Visit | Attending: Radiation Oncology | Admitting: Radiation Oncology

## 2016-08-05 ENCOUNTER — Encounter: Payer: Self-pay | Admitting: Radiation Oncology

## 2016-08-05 VITALS — BP 132/77 | HR 86 | Temp 98.3°F | Wt 144.2 lb

## 2016-08-05 DIAGNOSIS — Z510 Encounter for antineoplastic radiation therapy: Secondary | ICD-10-CM | POA: Diagnosis present

## 2016-08-05 DIAGNOSIS — C090 Malignant neoplasm of tonsillar fossa: Secondary | ICD-10-CM

## 2016-08-05 DIAGNOSIS — G20 Parkinson's disease: Secondary | ICD-10-CM | POA: Diagnosis not present

## 2016-08-05 DIAGNOSIS — F0390 Unspecified dementia without behavioral disturbance: Secondary | ICD-10-CM | POA: Diagnosis not present

## 2016-08-05 NOTE — Progress Notes (Signed)
Nathan Melton presents before his 4th fraction of radiation to his Right Tonsil and bilateral neck. He denies pain, but does report some pressure in his Right ear. He is not eating well because of a decreased appetite. He is currently drinking 2 Boosts daily. His wife plans to increase his Boost intake to 3-4 daily. He is not using his feeding tube at this time. The skin to his radiation site is normal appearing, he is using the [name] cream twice daily. He has no other concerns at this time.   BP 132/77   Pulse 86   Temp 98.3 F (36.8 C)   Wt 144 lb 3.2 oz (65.4 kg)   SpO2 97% Comment: room air  BMI 21.29 kg/m    Wt Readings from Last 3 Encounters:  08/05/16 144 lb 3.2 oz (65.4 kg)  07/30/16 146 lb 4.8 oz (66.4 kg)  07/22/16 146 lb 12.8 oz (66.6 kg)

## 2016-08-05 NOTE — Progress Notes (Signed)
   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  CBCT/MVCT images/Port film x-rays were reviewed.  The chart was checked.  No new concerns except swelling of right neck and ear pressure from mass.  2lb weight loss, decreased eating  Physical Findings:  Wt Readings from Last 3 Encounters:  08/05/16 144 lb 3.2 oz (65.4 kg)  07/30/16 146 lb 4.8 oz (66.4 kg)  07/22/16 146 lb 12.8 oz (66.6 kg)    weight is 144 lb 3.2 oz (65.4 kg). His temperature is 98.3 F (36.8 C). His blood pressure is 132/77 and his pulse is 86. His oxygen saturation is 97%.  Bulky right upper neck mass, no oral thrush  CBC    Component Value Date/Time   WBC 7.3 07/22/2016 0850   RBC 4.50 07/22/2016 0850   HGB 13.6 07/22/2016 0850   HCT 41.4 07/22/2016 0850   PLT 254 07/22/2016 0850   MCV 92.1 07/22/2016 0850   MCH 30.3 07/22/2016 0850   MCHC 32.9 07/22/2016 0850   RDW 15.3 (H) 07/22/2016 0850   LYMPHSABS 1.4 07/22/2016 0850   MONOABS 0.7 07/22/2016 0850   EOSABS 0.1 07/22/2016 0850   BASOSABS 0.0 07/22/2016 0850     CMP     Component Value Date/Time   NA 143 07/22/2016 0850   K 3.9 07/22/2016 0850   CO2 27 07/22/2016 0850   GLUCOSE 96 07/22/2016 0850   BUN 13.1 07/22/2016 0850   CREATININE 0.8 07/22/2016 0850   CALCIUM 9.4 07/22/2016 0850   PROT 7.4 07/22/2016 0850   ALBUMIN 3.4 (L) 07/22/2016 0850   AST 11 07/22/2016 0850   ALT <9 07/22/2016 0850   ALKPHOS 66 07/22/2016 0850   BILITOT 0.43 07/22/2016 0850     Impression:  The patient is tolerating radiotherapy.   Plan:  Continue radiotherapy as planned. Push PO or PEG intake.  Masses may swell initially before response to RT. -----------------------------------  Eppie Gibson, MD

## 2016-08-06 ENCOUNTER — Telehealth: Payer: Self-pay | Admitting: *Deleted

## 2016-08-06 ENCOUNTER — Ambulatory Visit
Admission: RE | Admit: 2016-08-06 | Discharge: 2016-08-06 | Disposition: A | Discharge status: Home / Self Care | Payer: Medicare Other | Source: Ambulatory Visit | Attending: Radiation Oncology | Admitting: Radiation Oncology

## 2016-08-06 ENCOUNTER — Encounter: Payer: Medicare Other | Admitting: Physical Therapy

## 2016-08-06 DIAGNOSIS — Z510 Encounter for antineoplastic radiation therapy: Secondary | ICD-10-CM | POA: Diagnosis not present

## 2016-08-06 NOTE — Telephone Encounter (Signed)
Gave info to [place], patient wife gave wrong pharmacy name to MD about RX for carafate and viscous lidociane

## 2016-08-07 ENCOUNTER — Ambulatory Visit
Admission: RE | Admit: 2016-08-07 | Discharge: 2016-08-07 | Disposition: A | Discharge status: Home / Self Care | Payer: Medicare Other | Source: Ambulatory Visit | Attending: Radiation Oncology | Admitting: Radiation Oncology

## 2016-08-07 DIAGNOSIS — Z510 Encounter for antineoplastic radiation therapy: Secondary | ICD-10-CM | POA: Diagnosis not present

## 2016-08-08 ENCOUNTER — Ambulatory Visit: Payer: Medicare Other | Admitting: Nutrition

## 2016-08-08 ENCOUNTER — Ambulatory Visit
Admission: RE | Admit: 2016-08-08 | Discharge: 2016-08-08 | Disposition: A | Discharge status: Home / Self Care | Payer: Medicare Other | Source: Ambulatory Visit | Attending: Radiation Oncology | Admitting: Radiation Oncology

## 2016-08-08 ENCOUNTER — Telehealth: Payer: Self-pay | Admitting: *Deleted

## 2016-08-08 DIAGNOSIS — Z510 Encounter for antineoplastic radiation therapy: Secondary | ICD-10-CM | POA: Diagnosis not present

## 2016-08-08 NOTE — Telephone Encounter (Signed)
"  Received a call about an appointment today at 3:15 pm.  What is this for."  Nutrition F/U is scheduled at 3:15 today after the 2:20 Radiation appointment.  Thanked me for this information.

## 2016-08-08 NOTE — Progress Notes (Signed)
Nutrition follow-up completed with patient who is being treated for tonsil cancer. Weight decreased and documented as 144.2 pounds decreased from 146.3 pounds. Patient refuses to drink more than 2 oral nutrition supplements. He is eating but not as much as he was. Wife would like to start using feeding tube.  Nutrition diagnosis: Unintended weight loss continues.  Intervention:  I educated patient's wife to provide one can Osmolite 1.5 with a 30 cc free water flush before and after bolus feeding. If formula is to thick, recommend adding 4 ounces of water to Osmolite 1.5 to provide easier infusion. Left message for nurse navigator to follow-up on feeding tube. Instructions were written out.  Questions were answered.  Teach back method used.  Monitoring, evaluation, goals: Patient will tolerate oral intake plus tube feeding to minimize weight loss.  Next visit: Thursday, October 12.  **Disclaimer: This note was dictated with voice recognition software. Similar sounding words can inadvertently be transcribed and this note may contain transcription errors which may not have been corrected upon publication of note.**

## 2016-08-09 ENCOUNTER — Telehealth: Payer: Self-pay | Admitting: Oncology

## 2016-08-09 ENCOUNTER — Other Ambulatory Visit: Payer: Self-pay | Admitting: Radiation Oncology

## 2016-08-09 ENCOUNTER — Encounter: Payer: Self-pay | Admitting: *Deleted

## 2016-08-09 ENCOUNTER — Ambulatory Visit
Admission: RE | Admit: 2016-08-09 | Discharge: 2016-08-09 | Disposition: A | Discharge status: Home / Self Care | Payer: Medicare Other | Source: Ambulatory Visit | Attending: Radiation Oncology | Admitting: Radiation Oncology

## 2016-08-09 DIAGNOSIS — C090 Malignant neoplasm of tonsillar fossa: Secondary | ICD-10-CM

## 2016-08-09 DIAGNOSIS — Z510 Encounter for antineoplastic radiation therapy: Secondary | ICD-10-CM | POA: Diagnosis not present

## 2016-08-09 MED ORDER — LIDOCAINE VISCOUS 2 % MT SOLN: OROMUCOSAL | 5 refills | Status: AC

## 2016-08-09 MED ORDER — SUCRALFATE 1 G PO TABS: ORAL_TABLET | ORAL | 5 refills | Status: AC

## 2016-08-09 MED FILL — SUCRALFATE 1 GM TABLET: 1 | 10 days supply | Qty: 40 | Fill #0

## 2016-08-09 MED FILL — LIDOCAINE 2% VISCOUS SOLN: 2 | 5 days supply | Qty: 100 | Fill #0

## 2016-08-09 NOTE — Telephone Encounter (Signed)
[facility] called and said patient is waiting for a prescription for Carafate and lidocaine.  Dr. Isidore Moos advised and will send prescriptions in.  [place] and advised them that prescription will be sent.

## 2016-08-12 ENCOUNTER — Telehealth: Payer: Self-pay | Admitting: *Deleted

## 2016-08-12 ENCOUNTER — Ambulatory Visit
Admission: RE | Admit: 2016-08-12 | Discharge: 2016-08-12 | Disposition: A | Discharge status: Home / Self Care | Payer: Medicare Other | Source: Ambulatory Visit | Attending: Radiation Oncology | Admitting: Radiation Oncology

## 2016-08-12 ENCOUNTER — Encounter: Payer: Self-pay | Admitting: Radiation Oncology

## 2016-08-12 VITALS — Temp 98.2°F | Ht 69.0 in | Wt 141.4 lb

## 2016-08-12 DIAGNOSIS — C090 Malignant neoplasm of tonsillar fossa: Secondary | ICD-10-CM

## 2016-08-12 DIAGNOSIS — Z510 Encounter for antineoplastic radiation therapy: Secondary | ICD-10-CM | POA: Diagnosis not present

## 2016-08-12 MED ORDER — HYDROCODONE-ACETAMINOPHEN 7.5-325 MG/15ML PO SOLN: ORAL | 0 refills | Status: DC

## 2016-08-12 MED ORDER — SENNOSIDES 8.8 MG/5ML PO SYRP: 5.0000 mL | ORAL_SOLUTION | Freq: Two times a day (BID) | ORAL | 5 refills | Status: AC

## 2016-08-12 NOTE — Telephone Encounter (Signed)
Oncology Nurse Navigator Documentation  [place] residence, spoke with son Randall Hiss, informed him his dad's SRT has been rescheduled for 5:00 this afternoon.  He indicated he relay information.  Gayleen Orem, RN, BSN, [place] at [place] [phone]

## 2016-08-12 NOTE — Progress Notes (Signed)
   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  CBCT/MVCT images/Port film x-rays were reviewed.  The chart was checked.  Starting to note sore throat.  Needs a larger feeding tube, Gayleen Orem, RN, our Head and Neck Oncology Navigator Contacting [facility]'s Dr. Liz Malady to coordinate.  Physical Findings:  Wt Readings from Last 3 Encounters:  08/12/16 141 lb 6.4 oz (64.1 kg)  08/05/16 144 lb 3.2 oz (65.4 kg)  07/30/16 146 lb 4.8 oz (66.4 kg)    height is 5\' 9"  (1.753 m) and weight is 141 lb 6.4 oz (64.1 kg). His temperature is 98.2 F (36.8 C). His oxygen saturation is 98%.  Bulky right upper neck mass, no oral thrush or mucositis  CBC    Component Value Date/Time   WBC 7.3 07/22/2016 0850   RBC 4.50 07/22/2016 0850   HGB 13.6 07/22/2016 0850   HCT 41.4 07/22/2016 0850   PLT 254 07/22/2016 0850   MCV 92.1 07/22/2016 0850   MCH 30.3 07/22/2016 0850   MCHC 32.9 07/22/2016 0850   RDW 15.3 (H) 07/22/2016 0850   LYMPHSABS 1.4 07/22/2016 0850   MONOABS 0.7 07/22/2016 0850   EOSABS 0.1 07/22/2016 0850   BASOSABS 0.0 07/22/2016 0850     CMP     Component Value Date/Time   NA 143 07/22/2016 0850   K 3.9 07/22/2016 0850   CO2 27 07/22/2016 0850   GLUCOSE 96 07/22/2016 0850   BUN 13.1 07/22/2016 0850   CREATININE 0.8 07/22/2016 0850   CALCIUM 9.4 07/22/2016 0850   PROT 7.4 07/22/2016 0850   ALBUMIN 3.4 (L) 07/22/2016 0850   AST 11 07/22/2016 0850   ALT <9 07/22/2016 0850   ALKPHOS 66 07/22/2016 0850   BILITOT 0.43 07/22/2016 0850     Impression:  The patient is tolerating radiotherapy.   Plan:  Continue radiotherapy as planned.   -----------------------------------  Eppie Gibson, MD

## 2016-08-12 NOTE — Progress Notes (Addendum)
Nathan Melton presents for radiation today. He denies pain. He reports some fatigue. He is not able to use his feeding tube due to the size of the feeding tube that was placed. It is too small to insert food into. Dr. Isidore Moos needs to speak to Dr. Twana First who placed it about getting a new larger tube. He is eating some food orally. He is also drinking water, though his wife feels like it is not enough. They report about 48 ounces of water daily. He is drinking 1 Boost daily. The skin to his neck is intact and he is using the sonafine cream twice daily.   Orthostatics: BP sitting 131/93 pulse 83, BP standing 116/87 pulse 90.  Temp 98.2 F (36.8 C)   Ht 5\' 9"  (1.753 m)   Wt 141 lb 6.4 oz (64.1 kg)   SpO2 98% Comment: room air  BMI 20.88 kg/m    Wt Readings from Last 3 Encounters:  08/12/16 141 lb 6.4 oz (64.1 kg)  08/05/16 144 lb 3.2 oz (65.4 kg)  07/30/16 146 lb 4.8 oz (66.4 kg)

## 2016-08-12 NOTE — Progress Notes (Signed)
Oncology Nurse Navigator Documentation  Met with Nathan Melton and Nathan Melton during XRT and afterwards to evaluate feeding tube. Tube has 2 ports with separate channels, one for medication administration, one for supplement. Able to administer 40 cc water flush through supplement port but required steady pressure with syringe plunger. Based on flush experience, did not attempt administration of supplement as it appears tube inside diameter is too restrictive. Nathan Melton is presently meeting all nutritional and hydration needs orally but it is highly likely he will need a more standard PEG to meet these needs in the near future. Recommended to Melton she contact neurologist who ordered tube placement, ask him to consult with ENT Dr. Conley Canal for placement of dedicated PEG for supplement. She voiced understanding.  Nathan Orem, RN, BSN, [place] at [place] [phone]

## 2016-08-13 ENCOUNTER — Ambulatory Visit
Admission: RE | Admit: 2016-08-13 | Discharge: 2016-08-13 | Disposition: A | Discharge status: Home / Self Care | Payer: Medicare Other | Source: Ambulatory Visit | Attending: Radiation Oncology | Admitting: Radiation Oncology

## 2016-08-13 DIAGNOSIS — Z510 Encounter for antineoplastic radiation therapy: Secondary | ICD-10-CM | POA: Diagnosis not present

## 2016-08-14 ENCOUNTER — Telehealth: Payer: Self-pay | Admitting: Oncology

## 2016-08-14 ENCOUNTER — Ambulatory Visit
Admission: RE | Admit: 2016-08-14 | Discharge: 2016-08-14 | Disposition: A | Discharge status: Home / Self Care | Payer: Medicare Other | Source: Ambulatory Visit | Attending: Radiation Oncology | Admitting: Radiation Oncology

## 2016-08-14 DIAGNOSIS — Z510 Encounter for antineoplastic radiation therapy: Secondary | ICD-10-CM | POA: Diagnosis not present

## 2016-08-14 NOTE — Telephone Encounter (Signed)
Nathan Melton from [facility] called and said [facility] regarding Nathan Melton prescription for [employer].  They need parameters for how the patient can take the medication PRN.  [place] at [phone] and spoke to Nathan Melton.  Advised him that the directions will be clarified tomorrow.  Also called the patient and spoke to his wife.  Nathan Melton does not need the medication yet and can wait until tomorrow to have it filled.  She also said his feeding tube revision has been scheduled for 08/21/16 in [place] so he will need to miss radiation on this day.

## 2016-08-15 ENCOUNTER — Ambulatory Visit
Admission: RE | Admit: 2016-08-15 | Discharge: 2016-08-15 | Disposition: A | Discharge status: Home / Self Care | Payer: Medicare Other | Source: Ambulatory Visit | Attending: Radiation Oncology | Admitting: Radiation Oncology

## 2016-08-15 ENCOUNTER — Encounter: Payer: Medicare Other | Admitting: Nutrition

## 2016-08-15 ENCOUNTER — Telehealth: Payer: Self-pay | Admitting: *Deleted

## 2016-08-15 DIAGNOSIS — Z510 Encounter for antineoplastic radiation therapy: Secondary | ICD-10-CM | POA: Diagnosis not present

## 2016-08-15 NOTE — Telephone Encounter (Signed)
Oncology Nurse Navigator Documentation  Received call from patient's wife. She informed he is having exchange of current PEG 10/18 [facility]) for larger one to accomodate nutritional/hydration instillation (15 Fr. Duopa PEG tube to be replaced with 20 Fr. size per Care Everywhere documentation).   He will be staying overnight for observation, will not be able to keep 10/18 XRT or 10/19 XRT and Dr. Alvy Bimler appts.  I encouraged keeping mid-afternoon 10/19 appts if at all possible, particularly XRT.  She indicated they would if able.  Gayleen Orem, RN, BSN, [place] at [place] [phone] -

## 2016-08-16 ENCOUNTER — Other Ambulatory Visit: Payer: Self-pay | Admitting: Radiation Oncology

## 2016-08-16 ENCOUNTER — Ambulatory Visit
Admission: RE | Admit: 2016-08-16 | Discharge: 2016-08-16 | Disposition: A | Discharge status: Home / Self Care | Payer: Medicare Other | Source: Ambulatory Visit | Attending: Radiation Oncology | Admitting: Radiation Oncology

## 2016-08-16 DIAGNOSIS — Z510 Encounter for antineoplastic radiation therapy: Secondary | ICD-10-CM | POA: Diagnosis not present

## 2016-08-16 DIAGNOSIS — C090 Malignant neoplasm of tonsillar fossa: Secondary | ICD-10-CM

## 2016-08-16 MED ORDER — HYDROCODONE-ACETAMINOPHEN 7.5-325 MG/15ML PO SOLN: ORAL | 0 refills | Status: AC

## 2016-08-16 NOTE — Telephone Encounter (Signed)
[facility] and advised them that the prescription for Hycet has been changed to: "take 10-40mL Q 4hrs prn pain can be taken q 4 hours prn."  The Pharmacist verbalized agreement.

## 2016-08-19 ENCOUNTER — Telehealth: Payer: Self-pay

## 2016-08-19 ENCOUNTER — Ambulatory Visit
Admission: RE | Admit: 2016-08-19 | Discharge: 2016-08-19 | Disposition: A | Discharge status: Home / Self Care | Payer: Medicare Other | Source: Ambulatory Visit | Attending: Radiation Oncology | Admitting: Radiation Oncology

## 2016-08-19 ENCOUNTER — Encounter: Payer: Self-pay | Admitting: Radiation Oncology

## 2016-08-19 VITALS — BP 156/97 | HR 73 | Temp 97.7°F | Wt 136.8 lb

## 2016-08-19 DIAGNOSIS — C090 Malignant neoplasm of tonsillar fossa: Secondary | ICD-10-CM

## 2016-08-19 DIAGNOSIS — Z510 Encounter for antineoplastic radiation therapy: Secondary | ICD-10-CM | POA: Diagnosis not present

## 2016-08-19 DIAGNOSIS — R5381 Other malaise: Secondary | ICD-10-CM

## 2016-08-19 LAB — BASIC METABOLIC PANEL
Anion Gap: 12 mEq/L — ABNORMAL HIGH (ref 3–11)
BUN: 18.2 mg/dL (ref 7.0–26.0)
CALCIUM: 9.7 mg/dL (ref 8.4–10.4)
CO2: 23 meq/L (ref 22–29)
CREATININE: 0.9 mg/dL (ref 0.7–1.3)
Chloride: 105 mEq/L (ref 98–109)
EGFR: >90 mL/min/{1.73_m2} (ref >90)
Glucose: 93 mg/dl (ref 70–140)
Potassium: 4.3 mEq/L (ref 3.5–5.1)
SODIUM: 139 meq/L (ref 136–145)

## 2016-08-19 NOTE — Progress Notes (Signed)
Mr. Abud is here for his 14th fraction of radiation to his Right Tonsil and bilateral neck. He reports pain a 7/10 to the roof of his mouth. He has not started taking Hycet yet, but his wife plans to pick it up today. He is using lidocaine and carafate several times a day. His wife reports that he is not eating and drinking well. He is having a new feeding tube placed on Wednesday at [facility]. He is drinking one ensure daily, and one bottle of water daily. He reports pain when he swallows. His radiation area is slightly red, he is using the sonafine three times daily.   BP 117/78   Pulse 92   Temp 97.7 F (36.5 C)   Wt 136 lb 12.8 oz (62.1 kg)   SpO2 98% Comment: room air  BMI 20.20 kg/m    Orthostatics: BP sitting 117/78 pulse 90. BP standing 156/97, pulse 73.   Wt Readings from Last 3 Encounters:  08/19/16 136 lb 12.8 oz (62.1 kg)  08/12/16 141 lb 6.4 oz (64.1 kg)  08/05/16 144 lb 3.2 oz (65.4 kg)

## 2016-08-19 NOTE — Telephone Encounter (Signed)
I called and left a voice mail informing Mr. Lantzy, and Mrs. Brodersen that his lab work looked good overall, but he might be slightly dehydrated. I asked her to try to get more fluid/milk/pedialyte down to drink. His glucose was normal.  I left my phone number to call if she had any further questions.

## 2016-08-19 NOTE — Progress Notes (Signed)
   Weekly Management Note:  Outpatient    ICD-9-CM ICD-10-CM   1. Carcinoma of tonsillar fossa (HCC) 146.1 0000000 Basic metabolic panel    Current Dose: 28 Gy  Projected Dose: 70 Gy   Narrative:  The patient presents for routine under treatment assessment.  CBCT/MVCT images/Port film x-rays were reviewed.  The chart was checked.  He is here for his 14th fraction of radiation to his Right Tonsil and bilateral neck. He reports pain a 7/10 to the roof of his mouth. He has not started taking Hycet yet, but his wife plans to pick it up today. He is using lidocaine and carafate several times a day. His wife reports that he is not eating and drinking well. He is having a new feeding tube placed on Wednesday at [facility]. He is drinking one ensure daily, and one bottle of water daily. He reports pain when he swallows. His radiation area is slightly red, he is using the sonafine three times daily.   BP 117/78   Pulse 92   Temp 97.7 F (36.5 C)   Wt 136 lb 12.8 oz (62.1 kg)   SpO2 98% Comment: room air  BMI 20.20 kg/m    Orthostatics: BP sitting 117/78 pulse 90. BP standing 156/97, pulse 73.  Physical Findings:  Wt Readings from Last 3 Encounters:  08/19/16 136 lb 12.8 oz (62.1 kg)  08/12/16 141 lb 6.4 oz (64.1 kg)  08/05/16 144 lb 3.2 oz (65.4 kg)    weight is 136 lb 12.8 oz (62.1 kg). His temperature is 97.7 F (36.5 C). His blood pressure is 156/97 (abnormal) and his pulse is 73. His oxygen saturation is 98%.  Bulky right upper neck mass, no oral thrush ; patchy mucositis in upper throat, skin intact over neck  CBC    Component Value Date/Time   WBC 7.3 07/22/2016 0850   RBC 4.50 07/22/2016 0850   HGB 13.6 07/22/2016 0850   HCT 41.4 07/22/2016 0850   PLT 254 07/22/2016 0850   MCV 92.1 07/22/2016 0850   MCH 30.3 07/22/2016 0850   MCHC 32.9 07/22/2016 0850   RDW 15.3 (H) 07/22/2016 0850   LYMPHSABS 1.4 07/22/2016 0850   MONOABS 0.7 07/22/2016 0850   EOSABS 0.1  07/22/2016 0850   BASOSABS 0.0 07/22/2016 0850     CMP     Component Value Date/Time   NA 143 07/22/2016 0850   K 3.9 07/22/2016 0850   CO2 27 07/22/2016 0850   GLUCOSE 96 07/22/2016 0850   BUN 13.1 07/22/2016 0850   CREATININE 0.8 07/22/2016 0850   CALCIUM 9.4 07/22/2016 0850   PROT 7.4 07/22/2016 0850   ALBUMIN 3.4 (L) 07/22/2016 0850   AST 11 07/22/2016 0850   ALT <9 07/22/2016 0850   ALKPHOS 66 07/22/2016 0850   BILITOT 0.43 07/22/2016 0850     Impression:  The patient is tolerating radiotherapy.   Plan:  Continue radiotherapy as planned.   BMP today Discussed nutritional options Discussed meds for sx management  -----------------------------------  Eppie Gibson, MD

## 2016-08-20 ENCOUNTER — Ambulatory Visit
Admission: RE | Admit: 2016-08-20 | Discharge: 2016-08-20 | Disposition: A | Discharge status: Home / Self Care | Payer: Medicare Other | Source: Ambulatory Visit | Attending: Radiation Oncology | Admitting: Radiation Oncology

## 2016-08-20 DIAGNOSIS — Z510 Encounter for antineoplastic radiation therapy: Secondary | ICD-10-CM | POA: Diagnosis not present

## 2016-08-20 LAB — TSH: TSH: 1.095 m[IU]/L (ref 0.320–4.118)

## 2016-08-21 ENCOUNTER — Encounter: Payer: Medicare Other | Admitting: Nutrition

## 2016-08-21 ENCOUNTER — Ambulatory Visit: Payer: Medicare Other

## 2016-08-21 MED FILL — LIDOCAINE 2% VISCOUS SOLN: 2 | 5 days supply | Qty: 100 | Fill #1

## 2016-08-22 ENCOUNTER — Ambulatory Visit (HOSPITAL_BASED_OUTPATIENT_CLINIC_OR_DEPARTMENT_OTHER): Payer: Medicare Other | Admitting: Hematology and Oncology

## 2016-08-22 ENCOUNTER — Encounter: Payer: Self-pay | Admitting: Hematology and Oncology

## 2016-08-22 ENCOUNTER — Encounter: Payer: Self-pay | Admitting: *Deleted

## 2016-08-22 ENCOUNTER — Ambulatory Visit
Admission: RE | Admit: 2016-08-22 | Discharge: 2016-08-22 | Disposition: A | Discharge status: Home / Self Care | Payer: Medicare Other | Source: Ambulatory Visit | Attending: Radiation Oncology | Admitting: Radiation Oncology

## 2016-08-22 DIAGNOSIS — E440 Moderate protein-calorie malnutrition: Secondary | ICD-10-CM | POA: Diagnosis not present

## 2016-08-22 DIAGNOSIS — K1233 Oral mucositis (ulcerative) due to radiation: Secondary | ICD-10-CM | POA: Diagnosis not present

## 2016-08-22 DIAGNOSIS — G20 Parkinson's disease: Secondary | ICD-10-CM | POA: Diagnosis not present

## 2016-08-22 DIAGNOSIS — R682 Dry mouth, unspecified: Secondary | ICD-10-CM | POA: Diagnosis not present

## 2016-08-22 DIAGNOSIS — Z510 Encounter for antineoplastic radiation therapy: Secondary | ICD-10-CM | POA: Diagnosis not present

## 2016-08-22 DIAGNOSIS — C090 Malignant neoplasm of tonsillar fossa: Secondary | ICD-10-CM

## 2016-08-22 NOTE — Assessment & Plan Note (Addendum)
He has recent weight loss. He has feeding tube placement Continue close follow-up with dietitian I will prescribe IVF if he gets dehydrated

## 2016-08-22 NOTE — Assessment & Plan Note (Signed)
He is undergoing treatment with expected side-effects Continue close follow-up for supportive care

## 2016-08-22 NOTE — Assessment & Plan Note (Signed)
The patient has very advanced Parkinson's disease. He is on some form of continuous pump for medication delivery I would defer to his neurologist for further management

## 2016-08-22 NOTE — Progress Notes (Signed)
Oncology Nurse Navigator Documentation  Met with Nathan Melton and his wife following RT to address her concerns about securing PEG. In addition, she expressed concern about his frequency of urination throughout last HS.  She noted that he had been drinking "lots of water" right up to bedtime, had instilled a can of supplement in late evening. I suggested that she limit fluid intake to small sips of water after 1900 to reduce need for nighttime urination.  She voiced understanding.  Joined them later during follow-up appt with Dr. Alvy Bimler. They voiced understanding of her role in assisting with symptom management and hydration needs as he continues with RT.  Gayleen Orem, RN, BSN, [place] at [place] [phone]

## 2016-08-23 ENCOUNTER — Ambulatory Visit
Admission: RE | Admit: 2016-08-23 | Discharge: 2016-08-23 | Disposition: A | Discharge status: Home / Self Care | Payer: Medicare Other | Source: Ambulatory Visit | Attending: Radiation Oncology | Admitting: Radiation Oncology

## 2016-08-23 DIAGNOSIS — R682 Dry mouth, unspecified: Secondary | ICD-10-CM | POA: Insufficient documentation

## 2016-08-23 DIAGNOSIS — Z510 Encounter for antineoplastic radiation therapy: Secondary | ICD-10-CM | POA: Diagnosis not present

## 2016-08-23 DIAGNOSIS — K1233 Oral mucositis (ulcerative) due to radiation: Secondary | ICD-10-CM | POA: Insufficient documentation

## 2016-08-23 NOTE — Assessment & Plan Note (Signed)
He is prescribed pain medications by his radiation oncologist

## 2016-08-23 NOTE — Assessment & Plan Note (Signed)
He has significant dry mouth Recommend increase oral liquid intake

## 2016-08-23 NOTE — Progress Notes (Signed)
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 Abnormal enhancing tissue abuts the right tongue base. Large enhancing and necrotic level 2A nodal conglomerate/mass measuring 4.7 x 2.7 x 3.3 cm, AP by transverse by CC, corresponding to the patient's hypermetabolic lesion on contemporaneous PET imaging. The mass invades the right carotid space with abnormal tissue encasing and narrowing the cervical right internal carotid artery with abnormal enhancing tissue extending to the right internal carotid artery; the artery remains grossly patent.The right external carotid artery is encased and narrowed by the enhancing mass, however, appears to remain patent. The right internal jugular vein is compressed and occluded at the level of the mass. There is a long segment nonocclusive filling defect of the left internal jugular vein inferior to the mass concerning for nonocclusive thrombus within both benign and malignant thrombus in the differential. There is a central filling defect in  the right EJ as well. Prominent rounded 9 mm right level 3 lymph node concerning for an additional site of metastatic disease as the node displays increased metabolic activity on contemporaneous PET imaging      07/05/2016 PET scan    Large hypermetabolic mass in the right neck as described above, concerning for malignancy. There is a large area of asymmetric and hypermetabolic soft tissue involving the right neck, which spans anteriorly involving the right palatine tonsil and posteriorly to the right sternocleidomastoid muscle, and measures approximately 6.3 x 4.3 cm. There is also asymmetric thickening and hypermetabolic activity within the right palatine tonsil. There are 2 adjacent lymph nodes, posterior to the right sternocleidomastoid muscle, which measure approximately 6 to 7 mm in size and demonstrate malignant range uptake of FDG. Ancillary head and neck CT findings: Intracranial atherosclerosis.Given asymmetric soft tissue thickening of the right palatine tonsil, this large mass could represent a primary lesion versus nodal disease. There are hypermetabolic lymph nodes posterior to the right sternocleidomastoid muscle concerning for nodal disease. No distant FDG avid metastatic disease in the chest, abdomen, pelvis.      07/31/2016 -  Radiation Therapy    He received radiation therapy without chemotherapy due to significant co-morbidities       INTERVAL HISTORY: Please see below for problem oriented charting. He returns with his wife Had placement of feeding tube Has mucositis, not severe No nausea or vomiting  REVIEW OF SYSTEMS:   Constitutional: Denies fevers, chills or abnormal weight loss Eyes: Denies blurriness of vision Respiratory: Denies cough, dyspnea or wheezes Cardiovascular: Denies palpitation, chest discomfort or lower extremity swelling Gastrointestinal:  Denies nausea, heartburn or change in bowel habits Skin: Denies abnormal  skin rashes Lymphatics: Denies new  lymphadenopathy or easy bruising Neurological:Denies numbness, tingling or new weaknesses Behavioral/Psych: Mood is stable, no new changes  All other systems were reviewed with the patient and are negative.  I have reviewed the past medical history, past surgical history, social history and family history with the patient and they are unchanged from previous note.  ALLERGIES:  has No Known Allergies.  MEDICATIONS:  Current Outpatient Prescriptions  Medication Sig Dispense Refill  . ALPRAZolam (XANAX) 0.25 MG tablet Take one half to one whole tablet as needed for anxiety.    Marland Kitchen amitriptyline (ELAVIL) 75 MG tablet Take 75 mg by mouth.    . CARBIDOPA-LEVODOPA EN by Enteral route.    . Carbidopa-Levodopa ER (SINEMET CR) 25-100 MG tablet controlled release Take by mouth. Take one tablet by mouth nightly    . docusate sodium (COLACE) 100 MG capsule Take 100 mg by mouth 2 (two) times daily.     Marland Kitchen donepezil (ARICEPT) 5 MG tablet Take 5 mg by mouth.    Marland Kitchen HYDROcodone-acetaminophen (HYCET) 7.5-325 mg/15 ml solution Take 10-44mL Q 4hrs prn pain, take with food. (Patient not taking: Reported on 08/19/2016) 473 mL 0  . lactobacillus acidophilus (BACID) TABS tablet Take 2 tablets by mouth 3 (three) times daily.    Marland Kitchen lidocaine (XYLOCAINE) 2 % solution Caregiver: Mix 1part 2% viscous lidocaine, 1part H20. Patient: Swish and swallow 25mL of this mixture, 55min before meals and QHS, up to QID 100 mL 5  . Multiple Vitamins-Minerals (MULTIVITAMIN WITH MINERALS) tablet Take 1 tablet by mouth daily.    . sennosides (SENOKOT) 8.8 MG/5ML syrup Take 5 mLs by mouth 2 (two) times daily. PRN constipation. 236 mL 5  . sucralfate (CARAFATE) 1 g tablet Dissolve 1 tablet in 10 mL H20 and swallow QID PRN soreness 40 tablet 5  . UNABLE TO FIND Med Name: carbidopa-levodopa pump continuous infusion for parkison's     No current facility-administered medications for this visit.     PHYSICAL EXAMINATION: ECOG PERFORMANCE  STATUS: 1 - Symptomatic but completely ambulatory  Vitals:   08/22/16 1528  Pulse: 78  Resp: 18  Temp: 98 F (36.7 C)   Filed Weights   08/22/16 1528  Weight: 139 lb 11.2 oz (63.4 kg)    GENERAL:alert, no distress and comfortable. Significant dyskinesia SKIN: skin color, texture, turgor are normal, no rashes or significant lesions EYES: normal, Conjunctiva are pink and non-injected, sclera clear OROPHARYNX:noted dry mouth. No thrush  NECK: supple, thyroid normal size, non-tender, without nodularity LYMPH:  no palpable lymphadenopathy in the cervical, axillary or inguinal LUNGS: clear to auscultation and percussion with normal breathing effort HEART: regular rate & rhythm and no murmurs and no lower extremity edema ABDOMEN:abdomen soft, non-tender and normal bowel sounds Musculoskeletal:no cyanosis of digits and no clubbing  NEURO: alert & oriented x 3   LABORATORY DATA:  I have reviewed the data as listed    Component Value Date/Time   NA 139 08/19/2016 1559   K 4.3 08/19/2016 1559   CO2 23 08/19/2016 1559   GLUCOSE 93 08/19/2016 1559   BUN 18.2 08/19/2016 1559   CREATININE 0.9 08/19/2016 1559   CALCIUM 9.7 08/19/2016 1559   PROT 7.4 07/22/2016 0850   ALBUMIN 3.4 (L) 07/22/2016 0850   AST 11 07/22/2016 0850   ALT <9 07/22/2016 0850   ALKPHOS 66 07/22/2016 0850   BILITOT 0.43 07/22/2016 0850    No results found for: SPEP, UPEP  Lab Results  Component  Value Date   WBC 7.3 07/22/2016   NEUTROABS 5.1 07/22/2016   HGB 13.6 07/22/2016   HCT 41.4 07/22/2016   MCV 92.1 07/22/2016   PLT 254 07/22/2016      Chemistry      Component Value Date/Time   NA 139 08/19/2016 1559   K 4.3 08/19/2016 1559   CO2 23 08/19/2016 1559   BUN 18.2 08/19/2016 1559   CREATININE 0.9 08/19/2016 1559      Component Value Date/Time   CALCIUM 9.7 08/19/2016 1559   ALKPHOS 66 07/22/2016 0850   AST 11 07/22/2016 0850   ALT <9 07/22/2016 0850   BILITOT 0.43 07/22/2016 0850       ASSESSMENT & PLAN:  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 He is on some form of continuous pump for medication delivery I would defer to his neurologist for further management  Protein-calorie malnutrition, moderate ([place]) He has recent weight loss. 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  All questions were answered. The patient knows to call the clinic with any problems, questions or concerns. No barriers to learning was detected. I spent 15 minutes counseling the patient face to face. The total time spent in the appointment was 20 minutes and more than 50% was on counseling and review of test results     Heath Lark, MD 08/23/2016 8:09 AM

## 2016-08-26 ENCOUNTER — Encounter: Payer: Self-pay | Admitting: *Deleted

## 2016-08-26 ENCOUNTER — Ambulatory Visit
Admission: RE | Admit: 2016-08-26 | Discharge: 2016-08-26 | Disposition: A | Discharge status: Home / Self Care | Payer: Medicare Other | Source: Ambulatory Visit | Attending: Radiation Oncology | Admitting: Radiation Oncology

## 2016-08-26 ENCOUNTER — Encounter: Payer: Self-pay | Admitting: Radiation Oncology

## 2016-08-26 VITALS — Temp 97.7°F | Wt 138.0 lb

## 2016-08-26 DIAGNOSIS — Z510 Encounter for antineoplastic radiation therapy: Secondary | ICD-10-CM | POA: Diagnosis not present

## 2016-08-26 DIAGNOSIS — C090 Malignant neoplasm of tonsillar fossa: Secondary | ICD-10-CM

## 2016-08-26 NOTE — Progress Notes (Signed)
   Weekly Management Note:  Outpatient    ICD-9-CM ICD-10-CM   1. Carcinoma of tonsillar fossa (HCC) 146.1 C09.0     Current Dose: 36 Gy  Projected Dose: 70 Gy   Narrative:  The patient presents for routine under treatment assessment.  CBCT/MVCT images/Port film x-rays were reviewed.  The chart was checked.  Mr. Nathan Melton presents for his 18th fraction of radiation to his Right Tonsil, Bilateral Neck. He denies pain. He is not using lidocaine for pain because he tells me he is eating well without it. He will use carafate occasionally. He is receiving 1 can of nutritional supplement through his feeding tube daily. He is eating well orally per his wife. He is drinking one ensure daily orally. He is drinking one bottle of water daily (16 ounces) He is also drinking 16 ounces of pedialyte daily.His neck is hyperpigmented and he is using sonafine three times daily. He feels well today, better than last week.   Physical Findings:  Wt Readings from Last 3 Encounters:  08/26/16 138 lb (62.6 kg)  08/22/16 139 lb 11.2 oz (63.4 kg)  08/19/16 136 lb 12.8 oz (62.1 kg)    weight is 138 lb (62.6 kg). His temperature is 97.7 F (36.5 C). His oxygen saturation is 99%.  Bulky right upper neck mass appears to be regressing slightly, no oral thrush ; patchy mucositis in upper throat, skin intact over neck, mucosa is moist  CBC    Component Value Date/Time   WBC 7.3 07/22/2016 0850   RBC 4.50 07/22/2016 0850   HGB 13.6 07/22/2016 0850   HCT 41.4 07/22/2016 0850   PLT 254 07/22/2016 0850   MCV 92.1 07/22/2016 0850   MCH 30.3 07/22/2016 0850   MCHC 32.9 07/22/2016 0850   RDW 15.3 (H) 07/22/2016 0850   LYMPHSABS 1.4 07/22/2016 0850   MONOABS 0.7 07/22/2016 0850   EOSABS 0.1 07/22/2016 0850   BASOSABS 0.0 07/22/2016 0850     CMP     Component Value Date/Time   NA 139 08/19/2016 1559   K 4.3 08/19/2016 1559   CO2 23 08/19/2016 1559   GLUCOSE 93 08/19/2016 1559   BUN 18.2 08/19/2016 1559   CREATININE 0.9 08/19/2016 1559   CALCIUM 9.7 08/19/2016 1559   PROT 7.4 07/22/2016 0850   ALBUMIN 3.4 (L) 07/22/2016 0850   AST 11 07/22/2016 0850   ALT <9 07/22/2016 0850   ALKPHOS 66 07/22/2016 0850   BILITOT 0.43 07/22/2016 0850     Impression:  The patient is tolerating radiotherapy.   Plan:  Continue radiotherapy as planned.      -----------------------------------  Eppie Gibson, MD

## 2016-08-26 NOTE — Progress Notes (Signed)
Mr. Kichline presents for his 18th fraction of radiation to his Right Tonsil, Bilateral Neck. He denies pain. He is not using lidocaine for pain because he tells me he is eating well without it. He will use carafate occasionally. He is receiving 1 can of nutritional supplement through his feeding tube daily. He is eating well orally per his wife. He is drinking one ensure daily orally. He is drinking one bottle of water daily (16 ounces) He is also drinking 16 ounces of pedialyte daily.His neck is hyperpigmented and he is using sonafine three times daily. He feels well today, better than last week.   Temp 97.7 F (36.5 C)   Wt 138 lb (62.6 kg)   SpO2 99% Comment: room air  BMI 20.38 kg/m   BP 136/97  Wt Readings from Last 3 Encounters:  08/26/16 138 lb (62.6 kg)  08/22/16 139 lb 11.2 oz (63.4 kg)  08/19/16 136 lb 12.8 oz (62.1 kg)

## 2016-08-27 ENCOUNTER — Ambulatory Visit: Payer: Medicare Other | Admitting: Nutrition

## 2016-08-27 ENCOUNTER — Ambulatory Visit
Admission: RE | Admit: 2016-08-27 | Discharge: 2016-08-27 | Disposition: A | Discharge status: Home / Self Care | Payer: Medicare Other | Source: Ambulatory Visit | Attending: Radiation Oncology | Admitting: Radiation Oncology

## 2016-08-27 DIAGNOSIS — Z510 Encounter for antineoplastic radiation therapy: Secondary | ICD-10-CM | POA: Diagnosis not present

## 2016-08-27 NOTE — Progress Notes (Signed)
Nutrition follow-up completed with patient and wife after radiation therapy for tonsil cancer. Weight documented as 138 pounds on October 23. Patient is tolerating one can Osmolite 1.5 through his feeding tube. He has been drinking Ensure Plus by mouth.  However, now states he is tired of this. He is able to drink Pedialyte without difficulty. Wife provides multiple options for patient that he likes to eat. Patient status post feeding tube revision in [place] on October 18.  Nutrition diagnosis: Unintended weight loss has continued.  Intervention: Educated patient's wife to increase Osmolite 1.5 via tube to 3 cans daily between meals. Encouraged patient to take oral foods by mouth as tolerated. Teach back method was used and questions were answered.  Monitoring, evaluation, goals: Patient will increase oral intake plus tube feeding to promote weight gain.  Next visit: Tuesday, November 7, after radiation treatment.  **Disclaimer: This note was dictated with voice recognition software. Similar sounding words can inadvertently be transcribed and this note may contain transcription errors which may not have been corrected upon publication of note.**

## 2016-08-28 ENCOUNTER — Telehealth: Payer: Self-pay | Admitting: Hematology and Oncology

## 2016-08-28 ENCOUNTER — Ambulatory Visit
Admission: RE | Admit: 2016-08-28 | Discharge: 2016-08-28 | Disposition: A | Discharge status: Home / Self Care | Payer: Medicare Other | Source: Ambulatory Visit | Attending: Radiation Oncology | Admitting: Radiation Oncology

## 2016-08-28 ENCOUNTER — Encounter: Payer: Self-pay | Admitting: *Deleted

## 2016-08-28 DIAGNOSIS — Z510 Encounter for antineoplastic radiation therapy: Secondary | ICD-10-CM | POA: Diagnosis not present

## 2016-08-28 NOTE — Progress Notes (Signed)
Oncology Nurse Navigator Documentation  In follow-up to call from Mrs. Laurance Flatten and her request to begin FMLA, I faxed letter to [place]: Mayra Neer addressing Mr. Straw's diagnosis, treatment and status (Chart Review, Letters).  Fax confirmation received.  Gayleen Orem, RN, BSN, [place] [facility] [phone]

## 2016-08-28 NOTE — Telephone Encounter (Signed)
NOT ABLE TO REACH PATIENT ON HOME/CELL RE 10/26 F/U WITH NG - NOT ABLE TO LEAVE MESSAGES.  ADDED COMMENT TO XRT APPOINTMENT TO SEND PT TO [facility] SCHEDULING RE 3:30PM F/U W/NG.

## 2016-08-29 ENCOUNTER — Telehealth: Payer: Self-pay | Admitting: Hematology and Oncology

## 2016-08-29 ENCOUNTER — Ambulatory Visit (HOSPITAL_BASED_OUTPATIENT_CLINIC_OR_DEPARTMENT_OTHER): Payer: Medicare Other | Admitting: Hematology and Oncology

## 2016-08-29 ENCOUNTER — Ambulatory Visit
Admission: RE | Admit: 2016-08-29 | Discharge: 2016-08-29 | Disposition: A | Discharge status: Home / Self Care | Payer: Medicare Other | Source: Ambulatory Visit | Attending: Radiation Oncology | Admitting: Radiation Oncology

## 2016-08-29 ENCOUNTER — Encounter: Payer: Self-pay | Admitting: Hematology and Oncology

## 2016-08-29 ENCOUNTER — Encounter: Payer: Self-pay | Admitting: Radiation Oncology

## 2016-08-29 DIAGNOSIS — K5900 Constipation, unspecified: Secondary | ICD-10-CM | POA: Insufficient documentation

## 2016-08-29 DIAGNOSIS — C090 Malignant neoplasm of tonsillar fossa: Secondary | ICD-10-CM

## 2016-08-29 DIAGNOSIS — R110 Nausea: Secondary | ICD-10-CM

## 2016-08-29 DIAGNOSIS — Z510 Encounter for antineoplastic radiation therapy: Secondary | ICD-10-CM | POA: Diagnosis not present

## 2016-08-29 DIAGNOSIS — E860 Dehydration: Secondary | ICD-10-CM

## 2016-08-29 DIAGNOSIS — K5909 Other constipation: Secondary | ICD-10-CM

## 2016-08-29 DIAGNOSIS — E440 Moderate protein-calorie malnutrition: Secondary | ICD-10-CM

## 2016-08-29 MED ORDER — ONDANSETRON HCL 8 MG PO TABS: 8.0000 mg | ORAL_TABLET | Freq: Three times a day (TID) | ORAL | 1 refills | Status: AC | PRN

## 2016-08-29 MED FILL — ONDANSETRON HCL 8 MG TABLET: 8 | 20 days supply | Qty: 60 | Fill #0

## 2016-08-29 MED FILL — SUCRALFATE 1 GM TABLET: 1 | 10 days supply | Qty: 40 | Fill #1

## 2016-08-29 NOTE — Progress Notes (Signed)
Patients wife paperwork (fmla) received, given to nurse 08/30/2016

## 2016-08-29 NOTE — Assessment & Plan Note (Addendum)
He has recent constipation and that in turn could have caused nausea. We discussed management with daily Miralax and stool softener. If he remains severely constipated, we may start him on regular doses of lactulose

## 2016-08-29 NOTE — Telephone Encounter (Signed)
Gave patient avs report and appointments for October and November.  °

## 2016-08-29 NOTE — Assessment & Plan Note (Signed)
He has recent weight loss. He has feeding tube placement Continue close follow-up with dietitian I will prescribe IVF

## 2016-08-29 NOTE — Assessment & Plan Note (Signed)
This could be related to constipation or side effects from radiation. I will start him on anti-emetics and IV fluid support

## 2016-08-29 NOTE — Assessment & Plan Note (Signed)
I will provide IV fluids Mondays, Wednesdays and Fridays weekly until his radiation therapy is completed

## 2016-08-29 NOTE — Assessment & Plan Note (Signed)
He is undergoing treatment with expected side-effects His wife noticed that he is getting dehydrated and has refuses adequate oral fluid intake. I will provide symptom management and will see him on a weekly basis for supportive care

## 2016-08-29 NOTE — Progress Notes (Signed)
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 Abnormal enhancing tissue abuts the right tongue base. Large enhancing and necrotic level 2A nodal conglomerate/mass measuring 4.7 x 2.7 x 3.3 cm, AP by transverse by CC, corresponding to the patient's hypermetabolic lesion on contemporaneous PET imaging. The mass invades the right carotid space with abnormal tissue encasing and narrowing the cervical right internal carotid artery with abnormal enhancing tissue extending to the right internal carotid artery; the artery remains grossly patent.The right external carotid artery is encased and narrowed by the enhancing mass, however, appears to remain patent. The right internal jugular vein is compressed and occluded at the level of the mass. There is a long segment nonocclusive filling defect of the left internal jugular vein inferior to the mass concerning for nonocclusive thrombus within both benign and malignant thrombus in the differential. There is a central filling defect in  the right EJ as well. Prominent rounded 9 mm right level 3 lymph node concerning for an additional site of metastatic disease as the node displays increased metabolic activity on contemporaneous PET imaging      07/05/2016 PET scan    Large hypermetabolic mass in the right neck as described above, concerning for malignancy. There is a large area of asymmetric and hypermetabolic soft tissue involving the right neck, which spans anteriorly involving the right palatine tonsil and posteriorly to the right sternocleidomastoid muscle, and measures approximately 6.3 x 4.3 cm. There is also asymmetric thickening and hypermetabolic activity within the right palatine tonsil. There are 2 adjacent lymph nodes, posterior to the right sternocleidomastoid muscle, which measure approximately 6 to 7 mm in size and demonstrate malignant range uptake of FDG. Ancillary head and neck CT findings: Intracranial atherosclerosis.Given asymmetric soft tissue thickening of the right palatine tonsil, this large mass could represent a primary lesion versus nodal disease. There are hypermetabolic lymph nodes posterior to the right sternocleidomastoid muscle concerning for nodal disease. No distant FDG avid metastatic disease in the chest, abdomen, pelvis.      07/31/2016 -  Radiation Therapy    He received radiation therapy without chemotherapy due to significant co-morbidities       INTERVAL HISTORY: Please see below for problem oriented charting. He returns today with his wife. He has started to use the feeding tube consistently and weight remain stable. His wife noted he has rare poor oral intake and passage of dark urine. He has recent nausea and constipation but no vomiting. His pain appears to be adequately controlled  REVIEW OF SYSTEMS:   Constitutional: Denies fevers, chills or abnormal weight loss Eyes: Denies  blurriness of vision Ears, nose, mouth, throat, and face: Denies mucositis or sore throat Respiratory:  Denies cough, dyspnea or wheezes Cardiovascular: Denies palpitation, chest discomfort or lower extremity swelling Skin: Denies abnormal skin rashes Lymphatics: Denies new lymphadenopathy or easy bruising Neurological:Denies numbness, tingling or new weaknesses Behavioral/Psych: Mood is stable, no new changes  All other systems were reviewed with the patient and are negative.  I have reviewed the past medical history, past surgical history, social history and family history with the patient and they are unchanged from previous note.  ALLERGIES:  has No Known Allergies.  MEDICATIONS:  Current Outpatient Prescriptions  Medication Sig Dispense Refill  . ALPRAZolam (XANAX) 0.25 MG tablet Take one half to one whole tablet as needed for anxiety.    Marland Kitchen amitriptyline (ELAVIL) 75 MG tablet Take 75 mg by mouth.    . CARBIDOPA-LEVODOPA EN by Enteral route.    . Carbidopa-Levodopa ER (SINEMET CR) 25-100 MG tablet controlled release Take by mouth. Take one tablet by mouth nightly    . docusate sodium (COLACE) 100 MG capsule Take 100 mg by mouth 2 (two) times daily.     Marland Kitchen donepezil (ARICEPT) 5 MG tablet Take 5 mg by mouth.    . lactobacillus acidophilus (BACID) TABS tablet Take 2 tablets by mouth 3 (three) times daily.    Marland Kitchen lidocaine (XYLOCAINE) 2 % solution Caregiver: Mix 1part 2% viscous lidocaine, 1part H20. Patient: Swish and swallow 61mL of this mixture, 52min before meals and QHS, up to QID 100 mL 5  . Multiple Vitamins-Minerals (MULTIVITAMIN WITH MINERALS) tablet Take 1 tablet by mouth daily.    . sennosides (SENOKOT) 8.8 MG/5ML syrup Take 5 mLs by mouth 2 (two) times daily. PRN constipation. 236 mL 5  . sucralfate (CARAFATE) 1 g tablet Dissolve 1 tablet in 10 mL H20 and swallow QID PRN soreness 40 tablet 5  . UNABLE TO FIND Med Name: carbidopa-levodopa pump continuous infusion for parkison's    . HYDROcodone-acetaminophen (HYCET) 7.5-325 mg/15 ml solution Take 10-24mL Q 4hrs prn pain, take with  food. (Patient not taking: Reported on 08/29/2016) 473 mL 0  . ondansetron (ZOFRAN) 8 MG tablet Take 1 tablet (8 mg total) by mouth every 8 (eight) hours as needed for nausea or vomiting. 60 tablet 1   No current facility-administered medications for this visit.     PHYSICAL EXAMINATION: ECOG PERFORMANCE STATUS: 1 - Symptomatic but completely ambulatory  Vitals:   08/29/16 1503  Pulse: 78  Resp: 18  Temp: 97.6 F (36.4 C)   Filed Weights   08/29/16 1503  Weight: 137 lb 6.4 oz (62.3 kg)    GENERAL:alert, no distress and comfortable SKIN: skin color, texture, turgor are normal, no rashes or significant lesions EYES: normal, Conjunctiva are pink and non-injected, sclera clear OROPHARYNX:no exudate, no erythema and lips, buccal mucosa, and tongue normal . No thrush NECK: supple, thyroid normal size, non-tender, without nodularity LYMPH:  no palpable lymphadenopathy in the cervical, axillary or inguinal LUNGS: clear to auscultation and percussion with normal breathing effort HEART: regular rate & rhythm and no murmurs and no lower extremity edema ABDOMEN:abdomen soft, non-tender and normal bowel sounds. Feeding tube site looks okay Musculoskeletal:no cyanosis of digits and no clubbing  NEURO: alert With significant dyskinesia  LABORATORY DATA:  I have reviewed the data as listed    Component Value Date/Time   NA 139 08/19/2016 1559   K 4.3 08/19/2016 1559   CO2 23 08/19/2016 1559   GLUCOSE 93 08/19/2016 1559  BUN 18.2 08/19/2016 1559   CREATININE 0.9 08/19/2016 1559   CALCIUM 9.7 08/19/2016 1559   PROT 7.4 07/22/2016 0850   ALBUMIN 3.4 (L) 07/22/2016 0850   AST 11 07/22/2016 0850   ALT <9 07/22/2016 0850   ALKPHOS 66 07/22/2016 0850   BILITOT 0.43 07/22/2016 0850    No results found for: SPEP, UPEP  Lab Results  Component Value Date   WBC 7.3 07/22/2016   NEUTROABS 5.1 07/22/2016   HGB 13.6 07/22/2016   HCT 41.4 07/22/2016   MCV 92.1 07/22/2016   PLT 254  07/22/2016      Chemistry      Component Value Date/Time   NA 139 08/19/2016 1559   K 4.3 08/19/2016 1559   CO2 23 08/19/2016 1559   BUN 18.2 08/19/2016 1559   CREATININE 0.9 08/19/2016 1559      Component Value Date/Time   CALCIUM 9.7 08/19/2016 1559   ALKPHOS 66 07/22/2016 0850   AST 11 07/22/2016 0850   ALT <9 07/22/2016 0850   BILITOT 0.43 07/22/2016 0850       ASSESSMENT & PLAN:  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 I will provide symptom management and will see him on a weekly basis for supportive care  Dehydration I will provide IV fluids Mondays, Wednesdays and Fridays weekly until his radiation therapy is completed  Protein-calorie malnutrition, moderate ([place]) He has recent weight loss. He has feeding tube placement Continue close follow-up with dietitian I will prescribe IVF  Chronic nausea This could be related to constipation or side effects from radiation. I will start him on anti-emetics and IV fluid support  Constipation He has recent constipation and that in turn could have caused nausea. We discussed management with daily Miralax and stool softener. If he remains severely constipated, we may start him on regular doses of lactulose   No orders of the defined types were placed in this encounter.  All questions were answered. The patient knows to call the clinic with any problems, questions or concerns. No barriers to learning was detected. I spent 20 minutes counseling the patient face to face. The total time spent in the appointment was 30 minutes and more than 50% was on counseling and review of test results     Nathan Lark, MD 08/29/2016 3:45 PM

## 2016-08-30 ENCOUNTER — Ambulatory Visit (HOSPITAL_BASED_OUTPATIENT_CLINIC_OR_DEPARTMENT_OTHER): Payer: Medicare Other

## 2016-08-30 ENCOUNTER — Other Ambulatory Visit: Payer: Self-pay | Admitting: Hematology and Oncology

## 2016-08-30 ENCOUNTER — Ambulatory Visit
Admission: RE | Admit: 2016-08-30 | Discharge: 2016-08-30 | Disposition: A | Discharge status: Home / Self Care | Payer: Medicare Other | Source: Ambulatory Visit | Attending: Radiation Oncology | Admitting: Radiation Oncology

## 2016-08-30 VITALS — BP 139/88 | HR 83 | Temp 98.0°F

## 2016-08-30 DIAGNOSIS — C090 Malignant neoplasm of tonsillar fossa: Secondary | ICD-10-CM

## 2016-08-30 DIAGNOSIS — E860 Dehydration: Secondary | ICD-10-CM | POA: Diagnosis present

## 2016-08-30 DIAGNOSIS — R682 Dry mouth, unspecified: Secondary | ICD-10-CM

## 2016-08-30 DIAGNOSIS — K1233 Oral mucositis (ulcerative) due to radiation: Secondary | ICD-10-CM

## 2016-08-30 DIAGNOSIS — Z510 Encounter for antineoplastic radiation therapy: Secondary | ICD-10-CM | POA: Diagnosis not present

## 2016-08-30 MED ORDER — ONDANSETRON HCL 4 MG/2ML IJ SOLN
INTRAMUSCULAR | Status: AC
  Filled 2016-08-30: qty 4

## 2016-08-30 MED ORDER — SODIUM CHLORIDE 0.9 % IV SOLN: Freq: Once | INTRAVENOUS | Status: DC

## 2016-08-30 MED ORDER — ONDANSETRON HCL 4 MG/2ML IJ SOLN
8.0000 mg | Freq: Once | INTRAMUSCULAR | Status: AC
  Administered 2016-08-30: 8 mg via INTRAVENOUS

## 2016-08-30 MED ORDER — ONDANSETRON HCL 8 MG PO TABS: 8.0000 mg | ORAL_TABLET | Freq: Once | ORAL | Status: DC

## 2016-08-30 MED ORDER — SODIUM CHLORIDE 0.9 % IV SOLN
Freq: Once | INTRAVENOUS | Status: AC
  Administered 2016-08-30: 08:00:00 via INTRAVENOUS

## 2016-08-30 NOTE — Patient Instructions (Signed)

## 2016-09-02 ENCOUNTER — Ambulatory Visit (HOSPITAL_BASED_OUTPATIENT_CLINIC_OR_DEPARTMENT_OTHER): Payer: Medicare Other | Admitting: Nurse Practitioner

## 2016-09-02 ENCOUNTER — Ambulatory Visit
Admission: RE | Admit: 2016-09-02 | Discharge: 2016-09-02 | Disposition: A | Discharge status: Home / Self Care | Payer: Medicare Other | Source: Ambulatory Visit | Attending: Radiation Oncology | Admitting: Radiation Oncology

## 2016-09-02 ENCOUNTER — Other Ambulatory Visit (HOSPITAL_BASED_OUTPATIENT_CLINIC_OR_DEPARTMENT_OTHER): Payer: Medicare Other

## 2016-09-02 VITALS — BP 100/61 | HR 73 | Resp 18 | Ht 69.0 in | Wt 132.8 lb

## 2016-09-02 VITALS — BP 109/54 | HR 74 | Temp 97.9°F | Resp 20 | Wt 136.0 lb

## 2016-09-02 DIAGNOSIS — C090 Malignant neoplasm of tonsillar fossa: Secondary | ICD-10-CM

## 2016-09-02 DIAGNOSIS — E860 Dehydration: Secondary | ICD-10-CM | POA: Diagnosis present

## 2016-09-02 DIAGNOSIS — Z510 Encounter for antineoplastic radiation therapy: Secondary | ICD-10-CM | POA: Diagnosis not present

## 2016-09-02 DIAGNOSIS — K1233 Oral mucositis (ulcerative) due to radiation: Secondary | ICD-10-CM

## 2016-09-02 DIAGNOSIS — R682 Dry mouth, unspecified: Secondary | ICD-10-CM

## 2016-09-02 LAB — COMPREHENSIVE METABOLIC PANEL
ALT: <6 U/L (ref 0–55)
ANION GAP: 9 meq/L (ref 3–11)
AST: 13 U/L (ref 5–34)
Albumin: 3.5 g/dL (ref 3.5–5.0)
Alkaline Phosphatase: 57 U/L (ref 40–150)
BUN: 9.6 mg/dL (ref 7.0–26.0)
CHLORIDE: 106 meq/L (ref 98–109)
CO2: 27 meq/L (ref 22–29)
Calcium: 9.5 mg/dL (ref 8.4–10.4)
Creatinine: 0.8 mg/dL (ref 0.7–1.3)
Glucose: 93 mg/dl (ref 70–140)
POTASSIUM: 4.4 meq/L (ref 3.5–5.1)
Sodium: 142 mEq/L (ref 136–145)
Total Bilirubin: 0.43 mg/dL (ref 0.20–1.20)
Total Protein: 7.2 g/dL (ref 6.4–8.3)

## 2016-09-02 LAB — CBC WITH DIFFERENTIAL/PLATELET
BASO%: 0.2 % (ref 0.0–2.0)
Basophils Absolute: 0 10*3/uL (ref 0.0–0.1)
EOS ABS: 0.1 10*3/uL (ref 0.0–0.5)
EOS%: 0.8 % (ref 0.0–7.0)
HCT: 43.6 % (ref 38.4–49.9)
HGB: 14.3 g/dL (ref 13.0–17.1)
LYMPH%: 4.9 % — AB (ref 14.0–49.0)
MCH: 30.1 pg (ref 27.2–33.4)
MCHC: 32.7 g/dL (ref 32.0–36.0)
MCV: 92 fL (ref 79.3–98.0)
MONO#: 0.7 10*3/uL (ref 0.1–0.9)
MONO%: 10.6 % (ref 0.0–14.0)
NEUT#: 5.7 10*3/uL (ref 1.5–6.5)
NEUT%: 83.5 % — AB (ref 39.0–75.0)
PLATELETS: 220 10*3/uL (ref 140–400)
RBC: 4.74 10*6/uL (ref 4.20–5.82)
RDW: 15.5 % — ABNORMAL HIGH (ref 11.0–14.6)
WBC: 6.8 10*3/uL (ref 4.0–10.3)
lymph#: 0.3 10*3/uL — ABNORMAL LOW (ref 0.9–3.3)

## 2016-09-02 MED ORDER — HEPARIN SOD (PORK) LOCK FLUSH 100 UNIT/ML IV SOLN
500.0000 [IU] | Freq: Once | INTRAVENOUS | Status: DC | PRN
  Filled 2016-09-02: qty 5

## 2016-09-02 MED ORDER — SODIUM CHLORIDE 0.9 % IV SOLN
Freq: Once | INTRAVENOUS | Status: AC
  Administered 2016-09-02: 11:00:00 via INTRAVENOUS

## 2016-09-02 MED ORDER — SODIUM CHLORIDE 0.9% FLUSH
10.0000 mL | INTRAVENOUS | Status: DC | PRN
  Filled 2016-09-02: qty 10

## 2016-09-02 NOTE — Patient Instructions (Signed)

## 2016-09-02 NOTE — Progress Notes (Signed)
   Weekly Management Note:  Outpatient    ICD-9-CM ICD-10-CM   1. Carcinoma of tonsillar fossa (HCC) 146.1 C09.0     Current Dose: 46 Gy  Projected Dose: 70 Gy   Narrative:  The patient presents for routine under treatment assessment.  CBCT/MVCT images/Port film x-rays were reviewed.  The chart was checked. Denies any new issues.   Physical Findings:  Wt Readings from Last 3 Encounters:  09/02/16 136 lb (61.7 kg)  09/02/16 132 lb 12.8 oz (60.2 kg)  08/29/16 137 lb 6.4 oz (62.3 kg)    weight is 136 lb (61.7 kg). His oral temperature is 97.9 F (36.6 C). His blood pressure is 109/54 (abnormal) and his pulse is 74. His respiration is 20 and oxygen saturation is 99%.  Bulky right upper neck mass appears to be regressing, still, slightly; no oral thrush ; dry mucosa, skin intact over neck   CBC    Component Value Date/Time   WBC 6.8 09/02/2016 0956   RBC 4.74 09/02/2016 0956   HGB 14.3 09/02/2016 0956   HCT 43.6 09/02/2016 0956   PLT 220 09/02/2016 0956   MCV 92.0 09/02/2016 0956   MCH 30.1 09/02/2016 0956   MCHC 32.7 09/02/2016 0956   RDW 15.5 (H) 09/02/2016 0956   LYMPHSABS 0.3 (L) 09/02/2016 0956   MONOABS 0.7 09/02/2016 0956   EOSABS 0.1 09/02/2016 0956   BASOSABS 0.0 09/02/2016 0956     CMP     Component Value Date/Time   NA 142 09/02/2016 0956   K 4.4 09/02/2016 0956   CO2 27 09/02/2016 0956   GLUCOSE 93 09/02/2016 0956   BUN 9.6 09/02/2016 0956   CREATININE 0.8 09/02/2016 0956   CALCIUM 9.5 09/02/2016 0956   PROT 7.2 09/02/2016 0956   ALBUMIN 3.5 09/02/2016 0956   AST 13 09/02/2016 0956   ALT <6 09/02/2016 0956   ALKPHOS 57 09/02/2016 0956   BILITOT 0.43 09/02/2016 0956     Impression:  The patient is tolerating radiotherapy.   Plan:  Continue radiotherapy as planned.  Biotene for dry mouth    -----------------------------------  Eppie Gibson, MD

## 2016-09-02 NOTE — Progress Notes (Signed)
PAIN: He is currently in no pain.  SWALLOWING/DIET: Pt denies dysphagia. Pt reports a supplement formula, 2 cans per day 71ml H20 flush. Peg tube site appearance- clean slight amount of yellow crusting. Oral exam reveals mild erythema, white coating, wife reports that is from the Carafate.  Sputum-Yellow, thick sputum  BOWEL: Pt reports constipation, bowel movements every 2-3 days SKIN: Skin exam reveals Hyperpigmentation, Pruritus, dry desquamation and erythema. Pt continues to apply [name] as directed. OTHER: Pt complains of loss of sleep and poor appetite.   WEIGHT/VS: BP (!) 109/54   Pulse 74   Temp 97.9 F (36.6 C) (Oral)   Resp 20   Wt 136 lb (61.7 kg)   SpO2 99%   BMI 20.08 kg/m  Wt Readings from Last 3 Encounters:  09/02/16 136 lb (61.7 kg)  09/02/16 132 lb 12.8 oz (60.2 kg)  08/29/16 137 lb 6.4 oz (62.3 kg)   Orthostatic VS BP: 56/15-pt had a lot of movement during assessment, did not want to make patient stand any longer, P- 86, Pox: 97%.

## 2016-09-02 NOTE — Progress Notes (Signed)

## 2016-09-03 ENCOUNTER — Ambulatory Visit
Admission: RE | Admit: 2016-09-03 | Discharge: 2016-09-03 | Disposition: A | Discharge status: Home / Self Care | Payer: Medicare Other | Source: Ambulatory Visit | Attending: Radiation Oncology | Admitting: Radiation Oncology

## 2016-09-03 DIAGNOSIS — Z510 Encounter for antineoplastic radiation therapy: Secondary | ICD-10-CM | POA: Diagnosis not present

## 2016-09-04 ENCOUNTER — Ambulatory Visit
Admission: RE | Admit: 2016-09-04 | Discharge: 2016-09-04 | Disposition: A | Discharge status: Home / Self Care | Payer: Medicare Other | Source: Ambulatory Visit | Attending: Radiation Oncology | Admitting: Radiation Oncology

## 2016-09-04 ENCOUNTER — Ambulatory Visit (HOSPITAL_BASED_OUTPATIENT_CLINIC_OR_DEPARTMENT_OTHER): Payer: Medicare Other | Admitting: Hematology and Oncology

## 2016-09-04 ENCOUNTER — Encounter: Payer: Self-pay | Admitting: Hematology and Oncology

## 2016-09-04 ENCOUNTER — Ambulatory Visit (HOSPITAL_BASED_OUTPATIENT_CLINIC_OR_DEPARTMENT_OTHER): Payer: Medicare Other | Admitting: Nurse Practitioner

## 2016-09-04 VITALS — BP 117/69 | HR 61 | Temp 97.5°F | Resp 18 | Ht 69.0 in | Wt 136.5 lb

## 2016-09-04 DIAGNOSIS — E860 Dehydration: Secondary | ICD-10-CM | POA: Diagnosis not present

## 2016-09-04 DIAGNOSIS — F0390 Unspecified dementia without behavioral disturbance: Secondary | ICD-10-CM | POA: Diagnosis not present

## 2016-09-04 DIAGNOSIS — R110 Nausea: Secondary | ICD-10-CM | POA: Diagnosis not present

## 2016-09-04 DIAGNOSIS — Z510 Encounter for antineoplastic radiation therapy: Secondary | ICD-10-CM | POA: Diagnosis present

## 2016-09-04 DIAGNOSIS — K5909 Other constipation: Secondary | ICD-10-CM | POA: Diagnosis not present

## 2016-09-04 DIAGNOSIS — C090 Malignant neoplasm of tonsillar fossa: Secondary | ICD-10-CM

## 2016-09-04 DIAGNOSIS — K1233 Oral mucositis (ulcerative) due to radiation: Secondary | ICD-10-CM

## 2016-09-04 DIAGNOSIS — G20 Parkinson's disease: Secondary | ICD-10-CM | POA: Diagnosis not present

## 2016-09-04 DIAGNOSIS — R682 Dry mouth, unspecified: Secondary | ICD-10-CM

## 2016-09-04 MED ORDER — SODIUM CHLORIDE 0.9 % IV SOLN
Freq: Once | INTRAVENOUS | Status: AC
  Administered 2016-09-04: 12:00:00 via INTRAVENOUS

## 2016-09-04 NOTE — Assessment & Plan Note (Signed)
He is undergoing treatment with expected side-effects His wife noticed that he is getting dehydrated and has refuses adequate oral fluid intake. I will provide symptom management and will see him on a weekly basis for supportive care He appears to be benefiting from IV fluids 3 times a week and we will continue the same

## 2016-09-04 NOTE — Assessment & Plan Note (Signed)
I will provide IV fluids Mondays, Wednesdays and Fridays weekly until his radiation therapy is completed

## 2016-09-04 NOTE — Assessment & Plan Note (Signed)
This could be related to constipation or side effects from radiation. I will start him on anti-emetics and IV fluid support

## 2016-09-04 NOTE — Patient Instructions (Signed)

## 2016-09-04 NOTE — Progress Notes (Signed)
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 Abnormal enhancing tissue abuts the right tongue base. Large enhancing and necrotic level 2A nodal conglomerate/mass measuring 4.7 x 2.7 x 3.3 cm, AP by transverse by CC, corresponding to the patient's hypermetabolic lesion on contemporaneous PET imaging. The mass invades the right carotid space with abnormal tissue encasing and narrowing the cervical right internal carotid artery with abnormal enhancing tissue extending to the right internal carotid artery; the artery remains grossly patent.The right external carotid artery is encased and narrowed by the enhancing mass, however, appears to remain patent. The right internal jugular vein is compressed and occluded at the level of the mass. There is a long segment nonocclusive filling defect of the left internal jugular vein inferior to the mass concerning for nonocclusive thrombus within both benign and malignant thrombus in the differential. There is a central filling defect in  the right EJ as well. Prominent rounded 9 mm right level 3 lymph node concerning for an additional site of metastatic disease as the node displays increased metabolic activity on contemporaneous PET imaging      07/05/2016 PET scan    Large hypermetabolic mass in the right neck as described above, concerning for malignancy. There is a large area of asymmetric and hypermetabolic soft tissue involving the right neck, which spans anteriorly involving the right palatine tonsil and posteriorly to the right sternocleidomastoid muscle, and measures approximately 6.3 x 4.3 cm. There is also asymmetric thickening and hypermetabolic activity within the right palatine tonsil. There are 2 adjacent lymph nodes, posterior to the right sternocleidomastoid muscle, which measure approximately 6 to 7 mm in size and demonstrate malignant range uptake of FDG. Ancillary head and neck CT findings: Intracranial atherosclerosis.Given asymmetric soft tissue thickening of the right palatine tonsil, this large mass could represent a primary lesion versus nodal disease. There are hypermetabolic lymph nodes posterior to the right sternocleidomastoid muscle concerning for nodal disease. No distant FDG avid metastatic disease in the chest, abdomen, pelvis.      07/31/2016 -  Radiation Therapy    He received radiation therapy without chemotherapy due to significant co-morbidities       INTERVAL HISTORY: Please see below for problem oriented charting. He is seen today in the infusion area. His wife felt that he is improving on IV fluid support His nausea is better control and he has success with recent laxative therapy and suppository for constipation. His weight is stable. His pain is under good control  REVIEW OF SYSTEMS:   Constitutional: Denies fevers, chills or abnormal weight loss Eyes: Denies blurriness of vision  Ears, nose, mouth, throat, and face: Denies mucositis or sore throat Respiratory: Denies cough, dyspnea or  wheezes Cardiovascular: Denies palpitation, chest discomfort or lower extremity swelling Skin: Denies abnormal skin rashes Lymphatics: Denies new lymphadenopathy or easy bruising Neurological:Denies numbness, tingling or new weaknesses Behavioral/Psych: Mood is stable, no new changes  All other systems were reviewed with the patient and are negative.  I have reviewed the past medical history, past surgical history, social history and family history with the patient and they are unchanged from previous note.  ALLERGIES:  has No Known Allergies.  MEDICATIONS:  Current Outpatient Prescriptions  Medication Sig Dispense Refill  . ALPRAZolam (XANAX) 0.25 MG tablet Take one half to one whole tablet as needed for anxiety.    Marland Kitchen amitriptyline (ELAVIL) 75 MG tablet Take 75 mg by mouth.    . CARBIDOPA-LEVODOPA EN by Enteral route.    . Carbidopa-Levodopa ER (SINEMET CR) 25-100 MG tablet controlled release Take by mouth. Take one tablet by mouth nightly    . docusate sodium (COLACE) 100 MG capsule Take 100 mg by mouth 2 (two) times daily.     Marland Kitchen donepezil (ARICEPT) 5 MG tablet Take 5 mg by mouth.    Marland Kitchen HYDROcodone-acetaminophen (HYCET) 7.5-325 mg/15 ml solution Take 10-6mL Q 4hrs prn pain, take with food. (Patient not taking: Reported on 08/29/2016) 473 mL 0  . lactobacillus acidophilus (BACID) TABS tablet Take 2 tablets by mouth 3 (three) times daily.    Marland Kitchen lidocaine (XYLOCAINE) 2 % solution Caregiver: Mix 1part 2% viscous lidocaine, 1part H20. Patient: Swish and swallow 38mL of this mixture, 6min before meals and QHS, up to QID 100 mL 5  . Multiple Vitamins-Minerals (MULTIVITAMIN WITH MINERALS) tablet Take 1 tablet by mouth daily.    . ondansetron (ZOFRAN) 8 MG tablet Take 1 tablet (8 mg total) by mouth every 8 (eight) hours as needed for nausea or vomiting. 60 tablet 1  . sennosides (SENOKOT) 8.8 MG/5ML syrup Take 5 mLs by mouth 2 (two) times daily. PRN constipation. 236 mL 5  . sucralfate (CARAFATE)  1 g tablet Dissolve 1 tablet in 10 mL H20 and swallow QID PRN soreness 40 tablet 5  . UNABLE TO FIND Med Name: carbidopa-levodopa pump continuous infusion for parkison's     No current facility-administered medications for this visit.     PHYSICAL EXAMINATION: ECOG PERFORMANCE STATUS: 1 - Symptomatic but completely ambulatory  Vitals:   09/04/16 1203  BP: 102/60  Pulse: 61  Resp: 18  Temp: 97.5 F (36.4 C)   Filed Weights   09/04/16 1203  Weight: 136 lb 12.8 oz (62.1 kg)    GENERAL:alert, no distress and comfortable SKIN: skin color, texture, turgor are normal, no rashes or significant lesions EYES: normal, Conjunctiva are pink and non-injected, sclera clear OROPHARYNX:no exudate, no erythema and lips, buccal mucosa, and tongue normal  NECK: supple, thyroid normal size, non-tender, without nodularity LYMPH:  no palpable lymphadenopathy in the cervical, axillary or inguinal LUNGS: clear to auscultation and percussion with normal breathing effort HEART: regular rate & rhythm and no murmurs and no lower extremity edema ABDOMEN:abdomen soft, non-tender and normal bowel sounds. Feeding tube site looks okay Musculoskeletal:no cyanosis of digits and no clubbing  NEURO: alert & oriented With dyskinesia  LABORATORY DATA:  I have reviewed the data as listed    Component Value Date/Time   NA 142 09/02/2016 0956   K 4.4 09/02/2016 0956   CO2 27 09/02/2016 0956   GLUCOSE 93 09/02/2016 0956  BUN 9.6 09/02/2016 0956   CREATININE 0.8 09/02/2016 0956   CALCIUM 9.5 09/02/2016 0956   PROT 7.2 09/02/2016 0956   ALBUMIN 3.5 09/02/2016 0956   AST 13 09/02/2016 0956   ALT <6 09/02/2016 0956   ALKPHOS 57 09/02/2016 0956   BILITOT 0.43 09/02/2016 0956    No results found for: SPEP, UPEP  Lab Results  Component Value Date   WBC 6.8 09/02/2016   NEUTROABS 5.7 09/02/2016   HGB 14.3 09/02/2016   HCT 43.6 09/02/2016   MCV 92.0 09/02/2016   PLT 220 09/02/2016      Chemistry       Component Value Date/Time   NA 142 09/02/2016 0956   K 4.4 09/02/2016 0956   CO2 27 09/02/2016 0956   BUN 9.6 09/02/2016 0956   CREATININE 0.8 09/02/2016 0956      Component Value Date/Time   CALCIUM 9.5 09/02/2016 0956   ALKPHOS 57 09/02/2016 0956   AST 13 09/02/2016 0956   ALT <6 09/02/2016 0956   BILITOT 0.43 09/02/2016 0956      ASSESSMENT & PLAN:  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 I will provide symptom management and will see him on a weekly basis for supportive care He appears to be benefiting from IV fluids 3 times a week and we will continue the same  Dehydration I will provide IV fluids Mondays, Wednesdays and Fridays weekly until his radiation therapy is completed  Constipation He has recent constipation and that in turn could have caused nausea. We discussed management with daily Miralax and stool softener.  Chronic nausea This could be related to constipation or side effects from radiation. I will start him on anti-emetics and IV fluid support   No orders of the defined types were placed in this encounter.  All questions were answered. The patient knows to call the clinic with any problems, questions or concerns. No barriers to learning was detected. I spent 15 minutes counseling the patient face to face. The total time spent in the appointment was 20 minutes and more than 50% was on counseling and review of test results     Heath Lark, MD 09/04/2016 6:37 PM

## 2016-09-04 NOTE — Assessment & Plan Note (Signed)
He has recent constipation and that in turn could have caused nausea. We discussed management with daily Miralax and stool softener.

## 2016-09-05 ENCOUNTER — Ambulatory Visit
Admission: RE | Admit: 2016-09-05 | Discharge: 2016-09-05 | Disposition: A | Discharge status: Home / Self Care | Payer: Medicare Other | Source: Ambulatory Visit | Attending: Radiation Oncology | Admitting: Radiation Oncology

## 2016-09-05 DIAGNOSIS — Z510 Encounter for antineoplastic radiation therapy: Secondary | ICD-10-CM | POA: Diagnosis not present

## 2016-09-06 ENCOUNTER — Ambulatory Visit (HOSPITAL_BASED_OUTPATIENT_CLINIC_OR_DEPARTMENT_OTHER): Payer: Medicare Other | Admitting: Nurse Practitioner

## 2016-09-06 ENCOUNTER — Ambulatory Visit
Admission: RE | Admit: 2016-09-06 | Discharge: 2016-09-06 | Disposition: A | Discharge status: Home / Self Care | Payer: Medicare Other | Source: Ambulatory Visit | Attending: Radiation Oncology | Admitting: Radiation Oncology

## 2016-09-06 VITALS — BP 130/97 | HR 70 | Temp 98.5°F | Resp 18 | Ht 69.0 in | Wt 136.8 lb

## 2016-09-06 DIAGNOSIS — Z510 Encounter for antineoplastic radiation therapy: Secondary | ICD-10-CM | POA: Diagnosis not present

## 2016-09-06 DIAGNOSIS — C090 Malignant neoplasm of tonsillar fossa: Secondary | ICD-10-CM | POA: Diagnosis not present

## 2016-09-06 DIAGNOSIS — E860 Dehydration: Secondary | ICD-10-CM | POA: Diagnosis present

## 2016-09-06 DIAGNOSIS — K1233 Oral mucositis (ulcerative) due to radiation: Secondary | ICD-10-CM

## 2016-09-06 DIAGNOSIS — R682 Dry mouth, unspecified: Secondary | ICD-10-CM

## 2016-09-06 MED ORDER — ONDANSETRON HCL 4 MG/2ML IJ SOLN
8.0000 mg | Freq: Once | INTRAMUSCULAR | Status: AC
  Administered 2016-09-06: 8 mg via INTRAVENOUS

## 2016-09-06 MED ORDER — SODIUM CHLORIDE 0.9 % IV SOLN: Freq: Once | INTRAVENOUS | Status: DC

## 2016-09-06 MED ORDER — ONDANSETRON HCL 4 MG/2ML IJ SOLN: 8.0000 mg | Freq: Once | INTRAMUSCULAR | Status: DC

## 2016-09-06 MED ORDER — ONDANSETRON HCL 4 MG/2ML IJ SOLN
INTRAMUSCULAR | Status: AC
  Filled 2016-09-06: qty 4

## 2016-09-06 MED ORDER — SODIUM CHLORIDE 0.9 % IV SOLN
Freq: Once | INTRAVENOUS | Status: AC
  Administered 2016-09-06: 11:00:00 via INTRAVENOUS

## 2016-09-06 MED FILL — LIDOCAINE 2% VISCOUS SOLN: 2 | 5 days supply | Qty: 100 | Fill #2

## 2016-09-06 NOTE — Patient Instructions (Signed)

## 2016-09-09 ENCOUNTER — Ambulatory Visit
Admission: RE | Admit: 2016-09-09 | Discharge: 2016-09-09 | Disposition: A | Discharge status: Home / Self Care | Payer: Medicare Other | Source: Ambulatory Visit | Attending: Radiation Oncology | Admitting: Radiation Oncology

## 2016-09-09 ENCOUNTER — Encounter: Payer: Self-pay | Admitting: Radiation Oncology

## 2016-09-09 ENCOUNTER — Ambulatory Visit (HOSPITAL_BASED_OUTPATIENT_CLINIC_OR_DEPARTMENT_OTHER): Payer: Medicare Other

## 2016-09-09 VITALS — BP 81/62 | HR 85 | Temp 97.5°F | Resp 18 | Ht 69.0 in | Wt 131.4 lb

## 2016-09-09 VITALS — BP 147/77 | HR 68 | Temp 97.6°F | Resp 18

## 2016-09-09 DIAGNOSIS — K1233 Oral mucositis (ulcerative) due to radiation: Secondary | ICD-10-CM

## 2016-09-09 DIAGNOSIS — E860 Dehydration: Secondary | ICD-10-CM

## 2016-09-09 DIAGNOSIS — C090 Malignant neoplasm of tonsillar fossa: Secondary | ICD-10-CM | POA: Diagnosis not present

## 2016-09-09 DIAGNOSIS — Z510 Encounter for antineoplastic radiation therapy: Secondary | ICD-10-CM | POA: Diagnosis not present

## 2016-09-09 DIAGNOSIS — R682 Dry mouth, unspecified: Secondary | ICD-10-CM

## 2016-09-09 MED ORDER — SODIUM CHLORIDE 0.9 % IV SOLN
Freq: Once | INTRAVENOUS | Status: AC
  Administered 2016-09-09: 16:00:00 via INTRAVENOUS

## 2016-09-09 NOTE — Progress Notes (Addendum)
PAIN: He is currently in no pain.  SWALLOWING/DIET: Pt denies dysphagia. Pt reports a supplement formula, 2 cans of Osmolite per day 53ml H20 flush. Peg tube site appearance- clean slight amount of yellow crusting. Oral exam reveals mild erythema, wife reports that is from the Carafate.  Sputum-brownish, thick sputum, sore throat at times taking Hydrocodone. BOWEL: Pt reports constipation, bowel movements last yesterday. SKIN: Skin exam reveals Hyperpigmentation, Pruritus, dry desquamation and erythema. Pt continues to apply [name] as directed. OTHER: Pt complains of poor appetite, sleep has improved over the past week. Wt Readings from Last 3 Encounters:  09/09/16 131 lb 6.4 oz (59.6 kg)  09/06/16 136 lb 12.8 oz (62.1 kg)  09/04/16 136 lb 12.8 oz (62.1 kg)   .BP 118/67   Pulse 98   Temp 97.5 F (36.4 C) (Oral)   Resp 18   Ht 5\' 9"  (1.753 m)   Wt 131 lb 6.4 oz (59.6 kg)   SpO2 97%   BMI 19.40 kg/m sitting BP (!) 81/62   Pulse 85   Temp 97.5 F (36.4 C) (Oral)   Resp 18   Ht 5\' 9"  (1.753 m)   Wt 131 lb 6.4 oz (59.6 kg)   SpO2 97%   BMI 19.40 kg/m  standing

## 2016-09-09 NOTE — Progress Notes (Signed)
   Weekly Management Note:  Outpatient    ICD-9-CM ICD-10-CM   1. 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  CBCT/MVCT images/Port film x-rays were reviewed.  The chart was checked. Decreased PO intake due to lack of appetite.  More involuntary movements from Parkinsons which may cause calories to be burned more.  Sees [facility] tomorrow,   IVF fluid today. Pain well controlled.  Physical Findings:  Wt Readings from Last 3 Encounters:  09/09/16 131 lb 6.4 oz (59.6 kg)  09/06/16 136 lb 12.8 oz (62.1 kg)  09/04/16 136 lb 12.8 oz (62.1 kg)    height is 5\' 9"  (1.753 m) and weight is 131 lb 6.4 oz (59.6 kg). His oral temperature is 97.5 F (36.4 C). His blood pressure is 81/62 (abnormal) and his pulse is 85. His respiration is 18 and oxygen saturation is 97%.  No oral thrush, skin intact over neck , involuntary movements of limbs  CBC    Component Value Date/Time   WBC 6.8 09/02/2016 0956   RBC 4.74 09/02/2016 0956   HGB 14.3 09/02/2016 0956   HCT 43.6 09/02/2016 0956   PLT 220 09/02/2016 0956   MCV 92.0 09/02/2016 0956   MCH 30.1 09/02/2016 0956   MCHC 32.7 09/02/2016 0956   RDW 15.5 (H) 09/02/2016 0956   LYMPHSABS 0.3 (L) 09/02/2016 0956   MONOABS 0.7 09/02/2016 0956   EOSABS 0.1 09/02/2016 0956   BASOSABS 0.0 09/02/2016 0956     CMP     Component Value Date/Time   NA 142 09/02/2016 0956   K 4.4 09/02/2016 0956   CO2 27 09/02/2016 0956   GLUCOSE 93 09/02/2016 0956   BUN 9.6 09/02/2016 0956   CREATININE 0.8 09/02/2016 0956   CALCIUM 9.5 09/02/2016 0956   PROT 7.2 09/02/2016 0956   ALBUMIN 3.5 09/02/2016 0956   AST 13 09/02/2016 0956   ALT <6 09/02/2016 0956   ALKPHOS 57 09/02/2016 0956   BILITOT 0.43 09/02/2016 0956     Impression:  The patient is tolerating radiotherapy.   Plan:  Continue radiotherapy as planned.    Discussed methods  to increase PEG/ PO intake and nutrition.  He sees [employer] tomorrow.  IVF to continue for hydration support.    -----------------------------------  Eppie Gibson, MD

## 2016-09-09 NOTE — Patient Instructions (Signed)

## 2016-09-10 ENCOUNTER — Encounter: Payer: Self-pay | Admitting: Radiation Oncology

## 2016-09-10 ENCOUNTER — Other Ambulatory Visit: Payer: Self-pay | Admitting: Radiation Oncology

## 2016-09-10 ENCOUNTER — Ambulatory Visit
Admission: RE | Admit: 2016-09-10 | Discharge: 2016-09-10 | Disposition: A | Discharge status: Home / Self Care | Payer: Medicare Other | Source: Ambulatory Visit | Attending: Radiation Oncology | Admitting: Radiation Oncology

## 2016-09-10 ENCOUNTER — Ambulatory Visit: Payer: Medicare Other | Admitting: Nutrition

## 2016-09-10 DIAGNOSIS — C090 Malignant neoplasm of tonsillar fossa: Secondary | ICD-10-CM

## 2016-09-10 DIAGNOSIS — Z510 Encounter for antineoplastic radiation therapy: Secondary | ICD-10-CM | POA: Diagnosis not present

## 2016-09-10 MED ORDER — GUAIFENESIN-DM 100-10 MG/5ML PO SYRP: 5.0000 mL | ORAL_SOLUTION | ORAL | 4 refills | Status: AC | PRN

## 2016-09-10 NOTE — Progress Notes (Signed)
Nutrition follow-up completed with patient and wife after radiation therapy for tonsil cancer. Weight decreased and documented as 131 pounds down from 138 pounds October 23. Patient is eating Jell-O and drinking some liquids. He is only tolerating 2 cans of Osmolite 1.5 via feeding tube. Wife states he refuses. Patient had a bowel movement yesterday. Denies nausea and vomiting.  Nutrition diagnosis: Unintended weight loss continues.  Intervention: Worked with patient's wife to come up with a tube feeding regimen will work for her and patient. She will give one can Osmolite 1.5 every 3 hours until goal rate of 6 cans daily achieved. She will provide 60 cc of free water before and after bolus feedings. In addition, she will provide 240 cc of water twice a day with medications. Tube feeding and free water flushes will provide 2130 cal, 89.4 g protein, 2166 mL free water. Wife was provided with written education.  Teach back method was used.  Monitoring, evaluation, goals: Patient will tolerate increased tube feeding to minimize further weight loss.  Next visit:Tuesday, November 14 after radiation therapy.  **Disclaimer: This note was dictated with voice recognition software. Similar sounding words can inadvertently be transcribed and this note may contain transcription errors which may not have been corrected upon publication of note.**

## 2016-09-10 NOTE — Progress Notes (Signed)
Patient's wife paperwork ([place]) received from doctor 09/09/16, faxed to [phone], conf received 09/09/16, copy given to patient 09/10/16

## 2016-09-10 NOTE — Progress Notes (Signed)
Received patient in nursing. Understand from the patient's wife that he was sipping water while walking and began to having a coughing spell. Two more dry coughing spells witnessed while patient was in the exam room. Pulse ox remained at 97% or better during the entire encounter. Wife requesting medication to relieve patient's cough. Phoned Dr. Isidore Moos with findings. Per Dr. Isidore Moos explained Robitussin DM has been escribed to [facility]. Instructed patient to try the Robitussin every four hours and let rad onc staff now if this doesn't help calm his cough. All verbalized understanding and expressed appreciation for the assistance.

## 2016-09-11 ENCOUNTER — Telehealth: Payer: Self-pay | Admitting: *Deleted

## 2016-09-11 ENCOUNTER — Telehealth: Payer: Self-pay | Admitting: Hematology and Oncology

## 2016-09-11 ENCOUNTER — Ambulatory Visit
Admission: RE | Admit: 2016-09-11 | Discharge: 2016-09-11 | Disposition: A | Discharge status: Home / Self Care | Payer: Medicare Other | Source: Ambulatory Visit | Attending: Radiation Oncology | Admitting: Radiation Oncology

## 2016-09-11 ENCOUNTER — Ambulatory Visit (HOSPITAL_BASED_OUTPATIENT_CLINIC_OR_DEPARTMENT_OTHER): Payer: Medicare Other

## 2016-09-11 VITALS — BP 93/61 | HR 82 | Temp 98.7°F | Resp 12

## 2016-09-11 DIAGNOSIS — C090 Malignant neoplasm of tonsillar fossa: Secondary | ICD-10-CM | POA: Diagnosis not present

## 2016-09-11 DIAGNOSIS — Z510 Encounter for antineoplastic radiation therapy: Secondary | ICD-10-CM | POA: Diagnosis not present

## 2016-09-11 DIAGNOSIS — Y842 Radiological procedure and radiotherapy as the cause of abnormal reaction of the patient, or of later complication, without mention of misadventure at the time of the procedure: Secondary | ICD-10-CM

## 2016-09-11 DIAGNOSIS — E860 Dehydration: Secondary | ICD-10-CM | POA: Diagnosis present

## 2016-09-11 DIAGNOSIS — K1233 Oral mucositis (ulcerative) due to radiation: Secondary | ICD-10-CM

## 2016-09-11 DIAGNOSIS — R682 Dry mouth, unspecified: Secondary | ICD-10-CM

## 2016-09-11 MED ORDER — SODIUM CHLORIDE 0.9 % IV SOLN
Freq: Once | INTRAVENOUS | Status: AC
  Administered 2016-09-11: 16:00:00 via INTRAVENOUS

## 2016-09-11 NOTE — Telephone Encounter (Signed)
Oncology Nurse Navigator Documentation  Received call from Maudie Mercury [facility] HR, indicating patient wife's FMLA paperwork has not been received.  I indicated information had been faxed on Monday, confirmation receipt received, copy given to Mrs. Steier.  She indicated she would check at fax machine, call me back if not found.  Gayleen Orem, RN, BSN, [address] at [place] [phone]

## 2016-09-11 NOTE — Telephone Encounter (Signed)
ADDED F/U TO 11/13 IVF APPOINTMENT PER 11/8 [place] [facility]. LEFT MESSAGE FOR PATIENT AND PATIENT TO GET NEW SCHEDULE AT 11/10 APPOINTMENTS. F/U FOR 11/9 PER 11/1 LOS NOT ADDED TO DUE [facility] FOR 11/13 F/U.

## 2016-09-11 NOTE — Patient Instructions (Signed)

## 2016-09-12 ENCOUNTER — Ambulatory Visit
Admission: RE | Admit: 2016-09-12 | Discharge: 2016-09-12 | Disposition: A | Discharge status: Home / Self Care | Payer: Medicare Other | Source: Ambulatory Visit | Attending: Radiation Oncology | Admitting: Radiation Oncology

## 2016-09-12 DIAGNOSIS — Z510 Encounter for antineoplastic radiation therapy: Secondary | ICD-10-CM | POA: Diagnosis not present

## 2016-09-13 ENCOUNTER — Encounter: Payer: Self-pay | Admitting: *Deleted

## 2016-09-13 ENCOUNTER — Encounter: Payer: Self-pay | Admitting: Nutrition

## 2016-09-13 ENCOUNTER — Ambulatory Visit
Admission: RE | Admit: 2016-09-13 | Discharge: 2016-09-13 | Disposition: A | Discharge status: Home / Self Care | Payer: Medicare Other | Source: Ambulatory Visit | Attending: Radiation Oncology | Admitting: Radiation Oncology

## 2016-09-13 ENCOUNTER — Ambulatory Visit (HOSPITAL_BASED_OUTPATIENT_CLINIC_OR_DEPARTMENT_OTHER): Payer: Medicare Other

## 2016-09-13 VITALS — BP 82/54 | HR 83 | Temp 98.3°F | Resp 16

## 2016-09-13 DIAGNOSIS — E860 Dehydration: Secondary | ICD-10-CM | POA: Diagnosis present

## 2016-09-13 DIAGNOSIS — C090 Malignant neoplasm of tonsillar fossa: Secondary | ICD-10-CM

## 2016-09-13 DIAGNOSIS — Z510 Encounter for antineoplastic radiation therapy: Secondary | ICD-10-CM | POA: Diagnosis not present

## 2016-09-13 DIAGNOSIS — K1233 Oral mucositis (ulcerative) due to radiation: Secondary | ICD-10-CM

## 2016-09-13 DIAGNOSIS — E440 Moderate protein-calorie malnutrition: Secondary | ICD-10-CM

## 2016-09-13 DIAGNOSIS — R682 Dry mouth, unspecified: Secondary | ICD-10-CM

## 2016-09-13 MED ORDER — SODIUM CHLORIDE 0.9 % IV SOLN: Freq: Once | INTRAVENOUS | Status: DC

## 2016-09-13 MED ORDER — ONDANSETRON HCL 4 MG/2ML IJ SOLN
INTRAMUSCULAR | Status: AC
  Filled 2016-09-13: qty 4

## 2016-09-13 MED ORDER — ONDANSETRON HCL 4 MG/2ML IJ SOLN
8.0000 mg | Freq: Once | INTRAMUSCULAR | Status: AC
  Administered 2016-09-13: 8 mg via INTRAVENOUS

## 2016-09-13 MED ORDER — HEPARIN SOD (PORK) LOCK FLUSH 100 UNIT/ML IV SOLN
500.0000 [IU] | Freq: Once | INTRAVENOUS | Status: DC | PRN
  Filled 2016-09-13: qty 5

## 2016-09-13 MED ORDER — HEPARIN SOD (PORK) LOCK FLUSH 100 UNIT/ML IV SOLN
250.0000 [IU] | Freq: Once | INTRAVENOUS | Status: DC | PRN
  Filled 2016-09-13: qty 5

## 2016-09-13 MED ORDER — SODIUM CHLORIDE 0.9 % IV SOLN
Freq: Once | INTRAVENOUS | Status: AC
  Administered 2016-09-13: 12:00:00 via INTRAVENOUS

## 2016-09-13 MED ORDER — SODIUM CHLORIDE 0.9% FLUSH
10.0000 mL | INTRAVENOUS | Status: DC | PRN
  Filled 2016-09-13: qty 10

## 2016-09-13 MED ORDER — OSMOLITE 1.5 CAL PO LIQD: ORAL | 0 refills | Status: AC

## 2016-09-13 MED ORDER — ALTEPLASE 2 MG IJ SOLR
2.0000 mg | Freq: Once | INTRAMUSCULAR | Status: DC | PRN
  Filled 2016-09-13: qty 2

## 2016-09-13 NOTE — Progress Notes (Signed)
Oncology Nurse Navigator Documentation  In follow-up to call from patient wife, met with [facility] in Infusion where he was receiving scheduled IVF. I provided her updated Epic calendar of appts for remainder of November, copy of completed FMLA forms recently submitted to her employer. They excitedly recognized next Wednesday will be his final XRT.  I explained follow-up appts with Dr. Isidore Moos will be made, he willl continue to be seen by Dr. Alvy Bimler for symptom management, a restaging PET will be scheduled 4 months post-treatment. They voiced understanding.  Gayleen Orem, RN, BSN, [place] at [place] [phone]

## 2016-09-13 NOTE — Patient Instructions (Signed)

## 2016-09-13 NOTE — Progress Notes (Signed)
65 year old male diagnosed with tonsil cancer to receive IM RT.  He is a patient of Dr. Isidore Moos.  Past medical history includes dementia and Parkinson's disease.  Medications include Xanax, Colace, multivitamin.  Labs were reviewed.  Height: 69 inches. Weight: 131 pounds. Usual body weight: 160 pounds. BMI: 19.4  Patient was seen with wife who provides a lot of his care. Patient is status post feeding tube placement at [facility].  Nutrition diagnosis:  Unintended weight loss continues  Estimated nutrition needs: 2085-2200 calories, 85-100 grams protein, 2.2 L fluid.  Intervention: Patient has a PEG for feeding with a second tube in his jejunostomy for medications. Tube feeding orders were written and [facility] home[facility] was notified.Patient will utilize Osmolite 1.5 via feeding tube.  One can every 3 hours to goal rate of 6 cans a day 60 cc free water before and after each can.In addition patient will give 240 cc free water via feeding tube twice a day. Patient wife were instructed on tube feeding administration.  6 cans Osmolite 1.5.  Will provide 2130 cal, 89.4 g protein, 2046 mL free water.  Monitoring, evaluation, goals:  Patient will tolerate tube feeding at goal rate for repletion and meeting greater than 90% of estimated nutrition needs.   Next visit: To be scheduled weekly with radiation therapy.  **Disclaimer: This note was dictated with voice recognition software. Similar sounding words can inadvertently be transcribed and this note may contain transcription errors which may not have been corrected upon publication of note.**      Electronically signed by Karie Mainland, RD at 07/30/2016 11:42 AM      Nutrition on 07/30/2016        Detailed Report

## 2016-09-16 ENCOUNTER — Ambulatory Visit
Admission: RE | Admit: 2016-09-16 | Discharge: 2016-09-16 | Disposition: A | Discharge status: Home / Self Care | Payer: Medicare Other | Source: Ambulatory Visit | Attending: Radiation Oncology | Admitting: Radiation Oncology

## 2016-09-16 ENCOUNTER — Encounter: Payer: Self-pay | Admitting: Radiation Oncology

## 2016-09-16 ENCOUNTER — Ambulatory Visit: Payer: Medicare Other

## 2016-09-16 ENCOUNTER — Ambulatory Visit (HOSPITAL_BASED_OUTPATIENT_CLINIC_OR_DEPARTMENT_OTHER): Payer: Medicare Other

## 2016-09-16 ENCOUNTER — Ambulatory Visit (HOSPITAL_BASED_OUTPATIENT_CLINIC_OR_DEPARTMENT_OTHER): Payer: Medicare Other | Admitting: Hematology and Oncology

## 2016-09-16 VITALS — Temp 98.1°F | Ht 69.0 in | Wt 135.0 lb

## 2016-09-16 DIAGNOSIS — C090 Malignant neoplasm of tonsillar fossa: Secondary | ICD-10-CM

## 2016-09-16 DIAGNOSIS — Z510 Encounter for antineoplastic radiation therapy: Secondary | ICD-10-CM | POA: Diagnosis not present

## 2016-09-16 DIAGNOSIS — E860 Dehydration: Secondary | ICD-10-CM

## 2016-09-16 DIAGNOSIS — K1233 Oral mucositis (ulcerative) due to radiation: Secondary | ICD-10-CM

## 2016-09-16 DIAGNOSIS — R682 Dry mouth, unspecified: Secondary | ICD-10-CM

## 2016-09-16 DIAGNOSIS — Y842 Radiological procedure and radiotherapy as the cause of abnormal reaction of the patient, or of later complication, without mention of misadventure at the time of the procedure: Secondary | ICD-10-CM

## 2016-09-16 DIAGNOSIS — R110 Nausea: Secondary | ICD-10-CM | POA: Diagnosis not present

## 2016-09-16 LAB — BASIC METABOLIC PANEL
Anion Gap: 10 mEq/L (ref 3–11)
BUN: 17.4 mg/dL (ref 7.0–26.0)
CHLORIDE: 104 meq/L (ref 98–109)
CO2: 24 meq/L (ref 22–29)
CREATININE: 0.9 mg/dL (ref 0.7–1.3)
Calcium: 9.5 mg/dL (ref 8.4–10.4)
EGFR: 90 mL/min/{1.73_m2} — ABNORMAL LOW (ref >90)
GLUCOSE: 94 mg/dL (ref 70–140)
POTASSIUM: 4.3 meq/L (ref 3.5–5.1)
Sodium: 138 mEq/L (ref 136–145)

## 2016-09-16 MED ORDER — ONDANSETRON HCL 4 MG/2ML IJ SOLN
8.0000 mg | Freq: Once | INTRAMUSCULAR | Status: AC
  Administered 2016-09-16: 8 mg via INTRAVENOUS

## 2016-09-16 MED ORDER — SODIUM CHLORIDE 0.9 % IV SOLN: Freq: Once | INTRAVENOUS | Status: DC

## 2016-09-16 MED ORDER — ONDANSETRON HCL 4 MG/2ML IJ SOLN
INTRAMUSCULAR | Status: AC
  Filled 2016-09-16: qty 2

## 2016-09-16 MED ORDER — SODIUM CHLORIDE 0.9 % IV SOLN
Freq: Once | INTRAVENOUS | Status: AC
  Administered 2016-09-16: 16:00:00 via INTRAVENOUS

## 2016-09-16 NOTE — Patient Instructions (Signed)

## 2016-09-16 NOTE — Progress Notes (Signed)
   Weekly Management Note:  Outpatient    ICD-9-CM ICD-10-CM   1. Carcinoma of tonsillar fossa (HCC) 146.1 0000000 Basic metabolic panel    Current Dose: 66 Gy  Projected Dose: 70 Gy   Narrative:  The patient presents for routine under treatment assessment.  CBCT/MVCT images/Port film x-rays were reviewed.  The chart was checked. His main issue is vomiting.  Grandchild recently in the house with a GI virus.    He has IV fluids scheduled MWF.  Standing BP is low, but could be affected by involuntary movements  Physical Findings:  Wt Readings from Last 3 Encounters:  09/16/16 135 lb (61.2 kg)  09/09/16 131 lb 6.4 oz (59.6 kg)  09/06/16 136 lb 12.8 oz (62.1 kg)    height is 5\' 9"  (1.753 m) and weight is 135 lb (61.2 kg). His temperature is 98.1 F (36.7 C). His oxygen saturation is 94%.  No oral thrush, + mucositis, moist mucosa,  skin intact over neck , involuntary movements of limbs  CBC    Component Value Date/Time   WBC 6.8 09/02/2016 0956   RBC 4.74 09/02/2016 0956   HGB 14.3 09/02/2016 0956   HCT 43.6 09/02/2016 0956   PLT 220 09/02/2016 0956   MCV 92.0 09/02/2016 0956   MCH 30.1 09/02/2016 0956   MCHC 32.7 09/02/2016 0956   RDW 15.5 (H) 09/02/2016 0956   LYMPHSABS 0.3 (L) 09/02/2016 0956   MONOABS 0.7 09/02/2016 0956   EOSABS 0.1 09/02/2016 0956   BASOSABS 0.0 09/02/2016 0956     CMP     Component Value Date/Time   NA 138 09/16/2016 1607   K 4.3 09/16/2016 1607   CO2 24 09/16/2016 1607   GLUCOSE 94 09/16/2016 1607   BUN 17.4 09/16/2016 1607   CREATININE 0.9 09/16/2016 1607   CALCIUM 9.5 09/16/2016 1607   PROT 7.2 09/02/2016 0956   ALBUMIN 3.5 09/02/2016 0956   AST 13 09/02/2016 0956   ALT <6 09/02/2016 0956   ALKPHOS 57 09/02/2016 0956   BILITOT 0.43 09/02/2016 0956     Impression:  The patient is tolerating radiotherapy.   Plan:  Continue radiotherapy as planned.  IVF to continue for hydration support.   Ordered BMP today - results are satisfactory  overall with some rise in BUN warranting IVF today as scheduled.  -----------------------------------  Eppie Gibson, MD

## 2016-09-16 NOTE — Progress Notes (Signed)
Mr. Phetteplace is here for his 33rd fraction of radiation to his Right Tonsil and bilateral neck. He denies pain. His wife reports that he has been vomiting since last evening. He has been unable to receive any nutrition since last evening. His wife reports that their grandchild who lives in the home with them has the same illness, and has been to the Emergency Room recently for this. He is to receive IV fluids today. He is getting the IV fluids Monday, Wednesday, Friday this week for dehydration. He not taking much in orally except soup and liquids. He reports thick saliva, and is trying diet gingerale and papaya juice to help relieve it. The skin to his neck is red, and hyperpigmented. He is using the sonafine twice daily. He reports constipation without a bowel movement in about 3-4 days, and is taking sennakot twice daily. He has tried miralax, but has been told by nutrition that they should not put it through his PEG tube.   Temp 98.1 F (36.7 C)   Ht 5\' 9"  (1.753 m)   Wt 135 lb (61.2 kg)   SpO2 94% Comment: room air  BMI 19.94 kg/m    Orthostatics: BP sitting 106/64 pulse 85, and BP standing 64/40 pulse 103.  Wt Readings from Last 3 Encounters:  09/16/16 135 lb (61.2 kg)  09/09/16 131 lb 6.4 oz (59.6 kg)  09/06/16 136 lb 12.8 oz (62.1 kg)

## 2016-09-17 ENCOUNTER — Ambulatory Visit: Payer: Medicare Other

## 2016-09-17 ENCOUNTER — Ambulatory Visit
Admission: RE | Admit: 2016-09-17 | Discharge: 2016-09-17 | Disposition: A | Discharge status: Home / Self Care | Payer: Medicare Other | Source: Ambulatory Visit | Attending: Radiation Oncology | Admitting: Radiation Oncology

## 2016-09-17 ENCOUNTER — Encounter: Payer: Medicare Other | Admitting: Nutrition

## 2016-09-17 ENCOUNTER — Ambulatory Visit (HOSPITAL_BASED_OUTPATIENT_CLINIC_OR_DEPARTMENT_OTHER): Payer: Medicare Other

## 2016-09-17 ENCOUNTER — Telehealth: Payer: Self-pay | Admitting: *Deleted

## 2016-09-17 DIAGNOSIS — K1233 Oral mucositis (ulcerative) due to radiation: Secondary | ICD-10-CM

## 2016-09-17 DIAGNOSIS — R682 Dry mouth, unspecified: Secondary | ICD-10-CM | POA: Diagnosis not present

## 2016-09-17 DIAGNOSIS — Z510 Encounter for antineoplastic radiation therapy: Secondary | ICD-10-CM | POA: Diagnosis not present

## 2016-09-17 DIAGNOSIS — E860 Dehydration: Secondary | ICD-10-CM

## 2016-09-17 MED ORDER — SODIUM CHLORIDE 0.9 % IV SOLN
Freq: Once | INTRAVENOUS | Status: AC
  Administered 2016-09-17: 15:00:00 via INTRAVENOUS

## 2016-09-17 MED ORDER — HEPARIN SOD (PORK) LOCK FLUSH 100 UNIT/ML IV SOLN
500.0000 [IU] | Freq: Once | INTRAVENOUS | Status: DC | PRN
  Filled 2016-09-17: qty 5

## 2016-09-17 MED ORDER — SODIUM CHLORIDE 0.9 % IV SOLN: Freq: Once | INTRAVENOUS | Status: DC

## 2016-09-17 MED ORDER — SODIUM CHLORIDE 0.9% FLUSH
10.0000 mL | INTRAVENOUS | Status: DC | PRN
  Filled 2016-09-17: qty 10

## 2016-09-17 MED ORDER — ONDANSETRON HCL 4 MG/2ML IJ SOLN
8.0000 mg | Freq: Once | INTRAMUSCULAR | Status: AC
  Administered 2016-09-17: 8 mg via INTRAVENOUS

## 2016-09-17 MED ORDER — ONDANSETRON HCL 4 MG/2ML IJ SOLN
INTRAMUSCULAR | Status: AC
  Filled 2016-09-17: qty 4

## 2016-09-17 NOTE — Telephone Encounter (Signed)
Oncology Nurse Navigator Documentation  Nathan Melton called to confirm recently scheduled IVF appts.  She understands today's appt is 1445 following 1425.  Nathan Orem, RN, BSN, [place] at [place] [phone]

## 2016-09-17 NOTE — Progress Notes (Signed)
Dr. Alvy Bimler made aware that pt reports productive cough with green sputum since Saturday.  Dr Alvy Bimler states she will monitor pt for now since his WBCs are normal and she assessed him yesterday.

## 2016-09-17 NOTE — Patient Instructions (Signed)
Dehydration, Adult Dehydration is a condition in which there is not enough fluid or water in the body. This happens when you lose more fluids than you take in. Important organs, such as the kidneys, brain, and heart, cannot function without a proper amount of fluids. Any loss of fluids from the body can lead to dehydration. Dehydration can range from mild to severe. This condition should be treated right away to prevent it from becoming severe. What are the causes? This condition may be caused by:  Vomiting.  Diarrhea.  Excessive sweating, such as from heat exposure or exercise.  Not drinking enough fluid, especially:  When ill.  While doing activity that requires a lot of energy.  Excessive urination.  Fever.  Infection.  Certain medicines, such as medicines that cause the body to lose excess fluid (diuretics).  Inability to access safe drinking water.  Reduced physical ability to get adequate water and food. What increases the risk? This condition is more likely to develop in people:  Who have a poorly controlled long-term (chronic) illness, such as diabetes, heart disease, or kidney disease.  Who are age 65 or older.  Who are disabled.  Who live in a place with high altitude.  Who play endurance sports. What are the signs or symptoms? Symptoms of mild dehydration may include:   Thirst.  Dry lips.  Slightly dry mouth.  Dry, warm skin.  Dizziness. Symptoms of moderate dehydration may include:   Very dry mouth.  Muscle cramps.  Dark urine. Urine may be the color of tea.  Decreased urine production.  Decreased tear production.  Heartbeat that is irregular or faster than normal (palpitations).  Headache.  Light-headedness, especially when you stand up from a sitting position.  Fainting (syncope). Symptoms of severe dehydration may include:   Changes in skin, such as:  Cold and clammy skin.  Blotchy (mottled) or pale skin.  Skin that does  not quickly return to normal after being lightly pinched and released (poor skin turgor).  Changes in body fluids, such as:  Extreme thirst.  No tear production.  Inability to sweat when body temperature is high, such as in hot weather.  Very little urine production.  Changes in vital signs, such as:  Weak pulse.  Pulse that is more than 100 beats a minute when sitting still.  Rapid breathing.  Low blood pressure.  Other changes, such as:  Sunken eyes.  Cold hands and feet.  Confusion.  Lack of energy (lethargy).  Difficulty waking up from sleep.  Short-term weight loss.  Unconsciousness. How is this diagnosed? This condition is diagnosed based on your symptoms and a physical exam. Blood and urine tests may be done to help confirm the diagnosis. How is this treated? Treatment for this condition depends on the severity. Mild or moderate dehydration can often be treated at home. Treatment should be started right away. Do not wait until dehydration becomes severe. Severe dehydration is an emergency and it needs to be treated in a hospital. Treatment for mild dehydration may include:   Drinking more fluids.  Replacing salts and minerals in your blood (electrolytes) that you may have lost. Treatment for moderate dehydration may include:   Drinking an oral rehydration solution (ORS). This is a drink that helps you replace fluids and electrolytes (rehydrate). It can be found at pharmacies and retail stores. Treatment for severe dehydration may include:   Receiving fluids through an IV tube.  Receiving an electrolyte solution through a feeding tube that is   passed through your nose and into your stomach (nasogastric tube, or NG tube).  Correcting any abnormalities in electrolytes.  Treating the underlying cause of dehydration. Follow these instructions at home:  If directed by your health care provider, drink an ORS:  Make an ORS by following instructions on the  package.  Start by drinking small amounts, about  cup (120 mL) every 5-10 minutes.  Slowly increase how much you drink until you have taken the amount recommended by your health care provider.  Drink enough clear fluid to keep your urine clear or pale yellow. If you were told to drink an ORS, finish the ORS first, then start slowly drinking other clear fluids. Drink fluids such as:  Water. Do not drink only water. Doing that can lead to having too little salt (sodium) in the body (hyponatremia).  Ice chips.  Fruit juice that you have added water to (diluted fruit juice).  Low-calorie sports drinks.  Avoid:  Alcohol.  Drinks that contain a lot of sugar. These include high-calorie sports drinks, fruit juice that is not diluted, and soda.  Caffeine.  Foods that are greasy or contain a lot of fat or sugar.  Take over-the-counter and prescription medicines only as told by your health care provider.  Do not take sodium tablets. This can lead to having too much sodium in the body (hypernatremia).  Eat foods that contain a healthy balance of electrolytes, such as bananas, oranges, potatoes, tomatoes, and spinach.  Keep all follow-up visits as told by your health care provider. This is important. Contact a health care provider if:  You have abdominal pain that:  Gets worse.  Stays in one area (localizes).  You have a rash.  You have a stiff neck.  You are more irritable than usual.  You are sleepier or more difficult to wake up than usual.  You feel weak or dizzy.  You feel very thirsty.  You have urinated only a small amount of very dark urine over 6-8 hours. Get help right away if:  You have symptoms of severe dehydration.  You cannot drink fluids without vomiting.  Your symptoms get worse with treatment.  You have a fever.  You have a severe headache.  You have vomiting or diarrhea that:  Gets worse.  Does not go away.  You have blood or green matter  (bile) in your vomit.  You have blood in your stool. This may cause stool to look black and tarry.  You have not urinated in 6-8 hours.  You faint.  Your heart rate while sitting still is over 100 beats a minute.  You have trouble breathing. This information is not intended to replace advice given to you by your health care provider. Make sure you discuss any questions you have with your health care provider. Document Released: 10/21/2005 Document Revised: 05/17/2016 Document Reviewed: 12/15/2015 Elsevier Interactive Patient Education  2017 [employer] Inc.  

## 2016-09-17 NOTE — Progress Notes (Signed)
Nutrition Follow-up:  Nutrition follow-up completed in infusion as patient receiving IV fluids. Patient with tonsil cancer.   Weight increased 135 lb yesterday (11/13) noted patient received IV fluids yesterday as well. Weight on 11/7 131 pounds.  Wife reports that last week after meeting with RD increased osmolite 1.5 to 6 cans per day and patient has been tolerating well. On Sunday, 11/12 patient took 5 cans but started vomiting.  Has been around sick grandchild with GI bug.  Yesterday was only able to take 1.5 cans of tube feeding and ate little bit of peach cobbler.  Today has had 2 cans of tube feeding so far and wife planning to give another can at 4pm, 7pm and 10 pm tonight.    Wife has adjusted water flush as MD had wanted her to mix miralax in 4 oz of water vs 8 oz water.  Understands that if patient unable to orally take water must be given via the tube.    Medications: reviewed Labs: reviewed   NUTRITION DIAGNOSIS: Unintended weight loss continues (weight increase likely fluid)    INTERVENTION:   Wife to continue to provide patient with osmolite 1.5, 6 cans per day with 43ml water before and after each can in addition to 215ml water BID. Tube feeding and water flush to provide 2130 calories, 89 g of protein and 218ml water.  Wife verbalized understanding of tube feeding regimen and importance of nutrition.  Wife will offer po food for pleasure but understands nutrition is coming from tube feeding.    MONITORING, EVALUATION, GOAL: Patient will continue to tolerate increased feeding to minimize further weight loss   NEXT VISIT: as needed  Tawny Raspberry B. Zenia Resides, [place], [place] (pager)

## 2016-09-18 ENCOUNTER — Encounter: Payer: Self-pay | Admitting: Hematology and Oncology

## 2016-09-18 ENCOUNTER — Ambulatory Visit: Payer: Medicare Other

## 2016-09-18 ENCOUNTER — Encounter: Payer: Self-pay | Admitting: *Deleted

## 2016-09-18 ENCOUNTER — Ambulatory Visit
Admission: RE | Admit: 2016-09-18 | Discharge: 2016-09-18 | Disposition: A | Discharge status: Home / Self Care | Payer: Medicare Other | Source: Ambulatory Visit | Attending: Radiation Oncology | Admitting: Radiation Oncology

## 2016-09-18 ENCOUNTER — Ambulatory Visit (HOSPITAL_BASED_OUTPATIENT_CLINIC_OR_DEPARTMENT_OTHER): Payer: Medicare Other

## 2016-09-18 VITALS — BP 97/55 | HR 86 | Temp 98.7°F | Resp 16

## 2016-09-18 DIAGNOSIS — R682 Dry mouth, unspecified: Secondary | ICD-10-CM

## 2016-09-18 DIAGNOSIS — Z510 Encounter for antineoplastic radiation therapy: Secondary | ICD-10-CM | POA: Diagnosis not present

## 2016-09-18 DIAGNOSIS — K1233 Oral mucositis (ulcerative) due to radiation: Secondary | ICD-10-CM

## 2016-09-18 DIAGNOSIS — E860 Dehydration: Secondary | ICD-10-CM

## 2016-09-18 DIAGNOSIS — C090 Malignant neoplasm of tonsillar fossa: Secondary | ICD-10-CM | POA: Diagnosis not present

## 2016-09-18 MED ORDER — SODIUM CHLORIDE 0.9 % IV SOLN
1000.0000 mL | Freq: Once | INTRAVENOUS | Status: AC
  Administered 2016-09-18: 1000 mL via INTRAVENOUS

## 2016-09-18 NOTE — Assessment & Plan Note (Signed)
I will provide IV fluids until his radiation therapy is completed

## 2016-09-18 NOTE — Patient Instructions (Signed)
Dehydration, Adult Dehydration is a condition in which there is not enough fluid or water in the body. This happens when you lose more fluids than you take in. Important organs, such as the kidneys, brain, and heart, cannot function without a proper amount of fluids. Any loss of fluids from the body can lead to dehydration. Dehydration can range from mild to severe. This condition should be treated right away to prevent it from becoming severe. What are the causes? This condition may be caused by:  Vomiting.  Diarrhea.  Excessive sweating, such as from heat exposure or exercise.  Not drinking enough fluid, especially:  When ill.  While doing activity that requires a lot of energy.  Excessive urination.  Fever.  Infection.  Certain medicines, such as medicines that cause the body to lose excess fluid (diuretics).  Inability to access safe drinking water.  Reduced physical ability to get adequate water and food. What increases the risk? This condition is more likely to develop in people:  Who have a poorly controlled long-term (chronic) illness, such as diabetes, heart disease, or kidney disease.  Who are age 65 or older.  Who are disabled.  Who live in a place with high altitude.  Who play endurance sports. What are the signs or symptoms? Symptoms of mild dehydration may include:   Thirst.  Dry lips.  Slightly dry mouth.  Dry, warm skin.  Dizziness. Symptoms of moderate dehydration may include:   Very dry mouth.  Muscle cramps.  Dark urine. Urine may be the color of tea.  Decreased urine production.  Decreased tear production.  Heartbeat that is irregular or faster than normal (palpitations).  Headache.  Light-headedness, especially when you stand up from a sitting position.  Fainting (syncope). Symptoms of severe dehydration may include:   Changes in skin, such as:  Cold and clammy skin.  Blotchy (mottled) or pale skin.  Skin that does  not quickly return to normal after being lightly pinched and released (poor skin turgor).  Changes in body fluids, such as:  Extreme thirst.  No tear production.  Inability to sweat when body temperature is high, such as in hot weather.  Very little urine production.  Changes in vital signs, such as:  Weak pulse.  Pulse that is more than 100 beats a minute when sitting still.  Rapid breathing.  Low blood pressure.  Other changes, such as:  Sunken eyes.  Cold hands and feet.  Confusion.  Lack of energy (lethargy).  Difficulty waking up from sleep.  Short-term weight loss.  Unconsciousness. How is this diagnosed? This condition is diagnosed based on your symptoms and a physical exam. Blood and urine tests may be done to help confirm the diagnosis. How is this treated? Treatment for this condition depends on the severity. Mild or moderate dehydration can often be treated at home. Treatment should be started right away. Do not wait until dehydration becomes severe. Severe dehydration is an emergency and it needs to be treated in a hospital. Treatment for mild dehydration may include:   Drinking more fluids.  Replacing salts and minerals in your blood (electrolytes) that you may have lost. Treatment for moderate dehydration may include:   Drinking an oral rehydration solution (ORS). This is a drink that helps you replace fluids and electrolytes (rehydrate). It can be found at pharmacies and retail stores. Treatment for severe dehydration may include:   Receiving fluids through an IV tube.  Receiving an electrolyte solution through a feeding tube that is   passed through your nose and into your stomach (nasogastric tube, or NG tube).  Correcting any abnormalities in electrolytes.  Treating the underlying cause of dehydration. Follow these instructions at home:  If directed by your health care provider, drink an ORS:  Make an ORS by following instructions on the  package.  Start by drinking small amounts, about  cup (120 mL) every 5-10 minutes.  Slowly increase how much you drink until you have taken the amount recommended by your health care provider.  Drink enough clear fluid to keep your urine clear or pale yellow. If you were told to drink an ORS, finish the ORS first, then start slowly drinking other clear fluids. Drink fluids such as:  Water. Do not drink only water. Doing that can lead to having too little salt (sodium) in the body (hyponatremia).  Ice chips.  Fruit juice that you have added water to (diluted fruit juice).  Low-calorie sports drinks.  Avoid:  Alcohol.  Drinks that contain a lot of sugar. These include high-calorie sports drinks, fruit juice that is not diluted, and soda.  Caffeine.  Foods that are greasy or contain a lot of fat or sugar.  Take over-the-counter and prescription medicines only as told by your health care provider.  Do not take sodium tablets. This can lead to having too much sodium in the body (hypernatremia).  Eat foods that contain a healthy balance of electrolytes, such as bananas, oranges, potatoes, tomatoes, and spinach.  Keep all follow-up visits as told by your health care provider. This is important. Contact a health care provider if:  You have abdominal pain that:  Gets worse.  Stays in one area (localizes).  You have a rash.  You have a stiff neck.  You are more irritable than usual.  You are sleepier or more difficult to wake up than usual.  You feel weak or dizzy.  You feel very thirsty.  You have urinated only a small amount of very dark urine over 6-8 hours. Get help right away if:  You have symptoms of severe dehydration.  You cannot drink fluids without vomiting.  Your symptoms get worse with treatment.  You have a fever.  You have a severe headache.  You have vomiting or diarrhea that:  Gets worse.  Does not go away.  You have blood or green matter  (bile) in your vomit.  You have blood in your stool. This may cause stool to look black and tarry.  You have not urinated in 6-8 hours.  You faint.  Your heart rate while sitting still is over 100 beats a minute.  You have trouble breathing. This information is not intended to replace advice given to you by your health care provider. Make sure you discuss any questions you have with your health care provider. Document Released: 10/21/2005 Document Revised: 05/17/2016 Document Reviewed: 12/15/2015 Elsevier Interactive Patient Education  2017 [employer] Inc.  

## 2016-09-18 NOTE — Assessment & Plan Note (Signed)
This could be related to constipation or side effects from radiation. I will start him on anti-emetics and IV fluid support I recommend round-the-clock Zofran. Unfortunately, other forms of antiemetics might interact with his Parkinson's medication

## 2016-09-18 NOTE — Assessment & Plan Note (Signed)
He is undergoing treatment with expected side-effects His wife noticed that he is getting dehydrated and has refuses adequate oral fluid intake. I will provide symptom management and will see him on a weekly basis for supportive care He appears to be benefiting from IV fluids and we will continue the same

## 2016-09-18 NOTE — Progress Notes (Signed)
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 Abnormal enhancing tissue abuts the right tongue base. Large enhancing and necrotic level 2A nodal conglomerate/mass measuring 4.7 x 2.7 x 3.3 cm, AP by transverse by CC, corresponding to the patient's hypermetabolic lesion on contemporaneous PET imaging. The mass invades the right carotid space with abnormal tissue encasing and narrowing the cervical right internal carotid artery with abnormal enhancing tissue extending to the right internal carotid artery; the artery remains grossly patent.The right external carotid artery is encased and narrowed by the enhancing mass, however, appears to remain patent. The right internal jugular vein is compressed and occluded at the level of the mass. There is a long segment nonocclusive filling defect of the left internal jugular vein inferior to the mass concerning for nonocclusive thrombus within both benign and malignant thrombus in the differential. There is a central filling defect in  the right EJ as well. Prominent rounded 9 mm right level 3 lymph node concerning for an additional site of metastatic disease as the node displays increased metabolic activity on contemporaneous PET imaging      07/05/2016 PET scan    Large hypermetabolic mass in the right neck as described above, concerning for malignancy. There is a large area of asymmetric and hypermetabolic soft tissue involving the right neck, which spans anteriorly involving the right palatine tonsil and posteriorly to the right sternocleidomastoid muscle, and measures approximately 6.3 x 4.3 cm. There is also asymmetric thickening and hypermetabolic activity within the right palatine tonsil. There are 2 adjacent lymph nodes, posterior to the right sternocleidomastoid muscle, which measure approximately 6 to 7 mm in size and demonstrate malignant range uptake of FDG. Ancillary head and neck CT findings: Intracranial atherosclerosis.Given asymmetric soft tissue thickening of the right palatine tonsil, this large mass could represent a primary lesion versus nodal disease. There are hypermetabolic lymph nodes posterior to the right sternocleidomastoid muscle concerning for nodal disease. No distant FDG avid metastatic disease in the chest, abdomen, pelvis.      07/31/2016 -  Radiation Therapy    He received radiation therapy without chemotherapy due to significant co-morbidities       INTERVAL HISTORY: Please see below for problem oriented charting. He is seen in the infusion room. He had an episode of vomiting over the weekend. He also had mild cough. Pain appears to be under good control. He is tolerating nutritional feeding well. Denies recent constipation  REVIEW OF SYSTEMS:   Constitutional: Denies fevers, chills or abnormal weight loss Eyes: Denies blurriness of vision Ears, nose, mouth, throat, and face: Denies mucositis or  sore throat Cardiovascular: Denies palpitation, chest discomfort or lower extremity  swelling Skin: Denies abnormal skin rashes Lymphatics: Denies new lymphadenopathy or easy bruising Neurological:Denies numbness, tingling or new weaknesses Behavioral/Psych: Mood is stable, no new changes  All other systems were reviewed with the patient and are negative.  I have reviewed the past medical history, past surgical history, social history and family history with the patient and they are unchanged from previous note.  ALLERGIES:  has No Known Allergies.  MEDICATIONS:  Current Outpatient Prescriptions  Medication Sig Dispense Refill  . ALPRAZolam (XANAX) 0.25 MG tablet Take one half to one whole tablet as needed for anxiety.    Marland Kitchen amitriptyline (ELAVIL) 75 MG tablet Take 75 mg by mouth.    . CARBIDOPA-LEVODOPA EN by Enteral route.    . Carbidopa-Levodopa ER (SINEMET CR) 25-100 MG tablet controlled release Take by mouth. Take one tablet by mouth nightly    . docusate sodium (COLACE) 100 MG capsule Take 100 mg by mouth 2 (two) times daily.     Marland Kitchen donepezil (ARICEPT) 5 MG tablet Take 5 mg by mouth.    Marland Kitchen guaiFENesin-dextromethorphan (ROBITUSSIN DM) 100-10 MG/5ML syrup Take 5 mLs by mouth every 4 (four) hours as needed for cough. 236 mL 4  . HYDROcodone-acetaminophen (HYCET) 7.5-325 mg/15 ml solution Take 10-54mL Q 4hrs prn pain, take with food. 473 mL 0  . lactobacillus acidophilus (BACID) TABS tablet Take 2 tablets by mouth 3 (three) times daily.    Marland Kitchen lidocaine (XYLOCAINE) 2 % solution Caregiver: Mix 1part 2% viscous lidocaine, 1part H20. Patient: Swish and swallow 56mL of this mixture, 39min before meals and QHS, up to QID 100 mL 5  . Multiple Vitamins-Minerals (MULTIVITAMIN WITH MINERALS) tablet Take 1 tablet by mouth daily.    . Nutritional Supplements (FEEDING SUPPLEMENT, OSMOLITE 1.5 CAL,) LIQD Give 1 can Osmolite 1.5 via feeding tube every 3 hours with 60 cc free water before and after bolus. Give 240 cc free water BID. 1422 mL 0  . ondansetron (ZOFRAN) 8 MG tablet Take 1 tablet  (8 mg total) by mouth every 8 (eight) hours as needed for nausea or vomiting. 60 tablet 1  . sennosides (SENOKOT) 8.8 MG/5ML syrup Take 5 mLs by mouth 2 (two) times daily. PRN constipation. 236 mL 5  . sucralfate (CARAFATE) 1 g tablet Dissolve 1 tablet in 10 mL H20 and swallow QID PRN soreness (Patient not taking: Reported on 09/16/2016) 40 tablet 5  . UNABLE TO FIND Med Name: carbidopa-levodopa pump continuous infusion for parkison's     No current facility-administered medications for this visit.     PHYSICAL EXAMINATION: ECOG PERFORMANCE STATUS: 1 - Symptomatic but completely ambulatory GENERAL:alert, no distress and comfortable SKIN: skin color, texture, turgor are normal, no rashes or significant lesions EYES: normal, Conjunctiva are pink and non-injected, sclera clear OROPHARYNX:no exudate, no erythema and lips, buccal mucosa, and tongue normal  NECK: supple, thyroid normal size, non-tender, without nodularity LYMPH:  no palpable lymphadenopathy in the cervical, axillary or inguinal LUNGS: clear to auscultation and percussion with normal breathing effort HEART: regular rate & rhythm and no murmurs and no lower extremity edema ABDOMEN:abdomen soft, non-tender and normal bowel sounds Musculoskeletal:no cyanosis of digits and no clubbing  NEURO: alert & oriented x 3 with Dyskinesia  LABORATORY DATA:  I have reviewed the data as listed    Component Value Date/Time   NA 138 09/16/2016 1607   K 4.3 09/16/2016 1607   CO2 24 09/16/2016 1607  GLUCOSE 94 09/16/2016 1607   BUN 17.4 09/16/2016 1607   CREATININE 0.9 09/16/2016 1607   CALCIUM 9.5 09/16/2016 1607   PROT 7.2 09/02/2016 0956   ALBUMIN 3.5 09/02/2016 0956   AST 13 09/02/2016 0956   ALT <6 09/02/2016 0956   ALKPHOS 57 09/02/2016 0956   BILITOT 0.43 09/02/2016 0956    No results found for: SPEP, UPEP  Lab Results  Component Value Date   WBC 6.8 09/02/2016   NEUTROABS 5.7 09/02/2016   HGB 14.3 09/02/2016   HCT 43.6  09/02/2016   MCV 92.0 09/02/2016   PLT 220 09/02/2016      Chemistry      Component Value Date/Time   NA 138 09/16/2016 1607   K 4.3 09/16/2016 1607   CO2 24 09/16/2016 1607   BUN 17.4 09/16/2016 1607   CREATININE 0.9 09/16/2016 1607      Component Value Date/Time   CALCIUM 9.5 09/16/2016 1607   ALKPHOS 57 09/02/2016 0956   AST 13 09/02/2016 0956   ALT <6 09/02/2016 0956   BILITOT 0.43 09/02/2016 0956       ASSESSMENT & PLAN:  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 I will provide symptom management and will see him on a weekly basis for supportive care He appears to be benefiting from IV fluids and we will continue the same  Chronic nausea This could be related to constipation or side effects from radiation. I will start him on anti-emetics and IV fluid support I recommend round-the-clock Zofran. Unfortunately, other forms of antiemetics might interact with his Parkinson's medication  Dehydration I will provide IV fluids until his radiation therapy is completed   No orders of the defined types were placed in this encounter.  All questions were answered. The patient knows to call the clinic with any problems, questions or concerns. No barriers to learning was detected. I spent 15 minutes counseling the patient face to face. The total time spent in the appointment was 20 minutes and more than 50% was on counseling and review of test results     Heath Lark, MD 09/18/2016 10:19 AM

## 2016-09-19 NOTE — Progress Notes (Signed)
Oncology Nurse Navigator Documentation  Met with Nathan Melton during final RT to offer support and to celebrate end of radiation treatment.  He was accompanied by his wife.  I provided wife with a Certificate of Recognition for her supportive care. I provided post-RT guidance:  Importance of keeping follow-up appts with Nutrition and SLP.  Importance of protecting treatment area from sun.  Continuation of [name] application 2-3 times daily. I explained that my role as navigator will continue for several more months and that I will be calling and/or joining him during follow-up visits.   I provided an Epic appt calendar for upcoming appts. I encouraged them to call me with needs/concerns.   They verbalized understanding of information provided.  Gayleen Orem, RN, BSN, [facility] [place] [phone]

## 2016-09-20 ENCOUNTER — Ambulatory Visit (HOSPITAL_BASED_OUTPATIENT_CLINIC_OR_DEPARTMENT_OTHER): Payer: Medicare Other

## 2016-09-20 VITALS — BP 108/91 | HR 94 | Temp 98.7°F | Resp 16

## 2016-09-20 DIAGNOSIS — K1233 Oral mucositis (ulcerative) due to radiation: Secondary | ICD-10-CM

## 2016-09-20 DIAGNOSIS — R682 Dry mouth, unspecified: Secondary | ICD-10-CM

## 2016-09-20 DIAGNOSIS — C090 Malignant neoplasm of tonsillar fossa: Secondary | ICD-10-CM

## 2016-09-20 DIAGNOSIS — E860 Dehydration: Secondary | ICD-10-CM

## 2016-09-20 MED ORDER — SODIUM CHLORIDE 0.9 % IV SOLN
Freq: Once | INTRAVENOUS | Status: AC
  Administered 2016-09-20: 10:00:00 via INTRAVENOUS

## 2016-09-20 NOTE — Patient Instructions (Signed)
Dehydration, Adult Dehydration is when there is not enough fluid or water in your body. This happens when you lose more fluids than you take in. Dehydration can range from mild to very bad. It should be treated right away to keep it from getting very bad. Symptoms of mild dehydration may include:   Thirst.  Dry lips.  Slightly dry mouth.  Dry, warm skin.  Dizziness. Symptoms of moderate dehydration may include:   Very dry mouth.  Muscle cramps.  Dark pee (urine). Pee may be the color of tea.  Your body making less pee.  Your eyes making fewer tears.  Heartbeat that is uneven or faster than normal (palpitations).  Headache.  Light-headedness, especially when you stand up from sitting.  Fainting (syncope). Symptoms of very bad dehydration may include:   Changes in skin, such as:  Cold and clammy skin.  Blotchy (mottled) or pale skin.  Skin that does not quickly return to normal after being lightly pinched and let go (poor skin turgor).  Changes in body fluids, such as:  Feeling very thirsty.  Your eyes making fewer tears.  Not sweating when body temperature is high, such as in hot weather.  Your body making very little pee.  Changes in vital signs, such as:  Weak pulse.  Pulse that is more than 100 beats a minute when you are sitting still.  Fast breathing.  Low blood pressure.  Other changes, such as:  Sunken eyes.  Cold hands and feet.  Confusion.  Lack of energy (lethargy).  Trouble waking up from sleep.  Short-term weight loss.  Unconsciousness. Follow these instructions at home:  If told by your doctor, drink an ORS:  Make an ORS by using instructions on the package.  Start by drinking small amounts, about  cup (120 mL) every 5-10 minutes.  Slowly drink more until you have had the amount that your doctor said to have.  Drink enough clear fluid to keep your pee clear or pale yellow. If you were told to drink an ORS, finish the  ORS first, then start slowly drinking clear fluids. Drink fluids such as:  Water. Do not drink only water by itself. Doing that can make the salt (sodium) level in your body get too low (hyponatremia).  Ice chips.  Fruit juice that you have added water to (diluted).  Low-calorie sports drinks.  Avoid:  Alcohol.  Drinks that have a lot of sugar. These include high-calorie sports drinks, fruit juice that does not have water added, and soda.  Caffeine.  Foods that are greasy or have a lot of fat or sugar.  Take over-the-counter and prescription medicines only as told by your doctor.  Do not take salt tablets. Doing that can make the salt level in your body get too high (hypernatremia).  Eat foods that have minerals (electrolytes). Examples include bananas, oranges, potatoes, tomatoes, and spinach.  Keep all follow-up visits as told by your doctor. This is important. Contact a doctor if:  You have belly (abdominal) pain that:  Gets worse.  Stays in one area (localizes).  You have a rash.  You have a stiff neck.  You get angry or annoyed more easily than normal (irritability).  You are more sleepy than normal.  You have a harder time waking up than normal.  You feel:  Weak.  Dizzy.  Very thirsty.  You have peed (urinated) only a small amount of very dark pee during 6-8 hours. Get help right away if:  You   have symptoms of very bad dehydration.  You cannot drink fluids without throwing up (vomiting).  Your symptoms get worse with treatment.  You have a fever.  You have a very bad headache.  You are throwing up or having watery poop (diarrhea) and it:  Gets worse.  Does not go away.  You have blood or something green (bile) in your throw-up.  You have blood in your poop (stool). This may cause poop to look black and tarry.  You have not peed in 6-8 hours.  You pass out (faint).  Your heart rate when you are sitting still is more than 100 beats a  minute.  You have trouble breathing. This information is not intended to replace advice given to you by your health care provider. Make sure you discuss any questions you have with your health care provider. Document Released: 08/17/2009 Document Revised: 05/10/2016 Document Reviewed: 12/15/2015 Elsevier Interactive Patient Education  2017 [employer]  

## 2016-09-23 ENCOUNTER — Ambulatory Visit (HOSPITAL_BASED_OUTPATIENT_CLINIC_OR_DEPARTMENT_OTHER): Payer: Medicare Other | Admitting: Hematology and Oncology

## 2016-09-23 ENCOUNTER — Telehealth: Payer: Self-pay | Admitting: Hematology and Oncology

## 2016-09-23 ENCOUNTER — Ambulatory Visit (HOSPITAL_BASED_OUTPATIENT_CLINIC_OR_DEPARTMENT_OTHER): Payer: Medicare Other

## 2016-09-23 VITALS — BP 100/56 | HR 89 | Temp 98.9°F | Resp 20

## 2016-09-23 DIAGNOSIS — G20 Parkinson's disease: Secondary | ICD-10-CM

## 2016-09-23 DIAGNOSIS — E860 Dehydration: Secondary | ICD-10-CM

## 2016-09-23 DIAGNOSIS — C090 Malignant neoplasm of tonsillar fossa: Secondary | ICD-10-CM

## 2016-09-23 DIAGNOSIS — R300 Dysuria: Secondary | ICD-10-CM

## 2016-09-23 DIAGNOSIS — R682 Dry mouth, unspecified: Secondary | ICD-10-CM | POA: Diagnosis not present

## 2016-09-23 DIAGNOSIS — K1233 Oral mucositis (ulcerative) due to radiation: Secondary | ICD-10-CM | POA: Diagnosis not present

## 2016-09-23 DIAGNOSIS — E440 Moderate protein-calorie malnutrition: Secondary | ICD-10-CM | POA: Diagnosis not present

## 2016-09-23 LAB — URINALYSIS, MICROSCOPIC - CHCC
BILIRUBIN (URINE): NEGATIVE
BLOOD: NEGATIVE
Glucose: NEGATIVE mg/dL
KETONES: 5 mg/dL
LEUKOCYTE ESTERASE: NEGATIVE
Nitrite: NEGATIVE
Protein: NEGATIVE mg/dL
RBC / HPF: NEGATIVE (ref 0–2)
SPECIFIC GRAVITY, URINE: 1.015 (ref 1.003–1.035)
UROBILINOGEN UR: 0.2 mg/dL (ref 0.2–1)
WBC, UA: NEGATIVE (ref 0–2)
pH: 6 (ref 4.6–8.0)

## 2016-09-23 MED ORDER — SODIUM CHLORIDE 0.9 % IV SOLN
Freq: Once | INTRAVENOUS | Status: AC
  Administered 2016-09-23: 15:00:00 via INTRAVENOUS

## 2016-09-23 NOTE — Telephone Encounter (Signed)
Appointments scheduled per 11/20 LOS. Patient given AVS report and calendars with future scheduled appointments. °

## 2016-09-23 NOTE — Patient Instructions (Signed)
Dehydration, Adult Dehydration is when there is not enough fluid or water in your body. This happens when you lose more fluids than you take in. Dehydration can range from mild to very bad. It should be treated right away to keep it from getting very bad. Symptoms of mild dehydration may include:   Thirst.  Dry lips.  Slightly dry mouth.  Dry, warm skin.  Dizziness. Symptoms of moderate dehydration may include:   Very dry mouth.  Muscle cramps.  Dark pee (urine). Pee may be the color of tea.  Your body making less pee.  Your eyes making fewer tears.  Heartbeat that is uneven or faster than normal (palpitations).  Headache.  Light-headedness, especially when you stand up from sitting.  Fainting (syncope). Symptoms of very bad dehydration may include:   Changes in skin, such as:  Cold and clammy skin.  Blotchy (mottled) or pale skin.  Skin that does not quickly return to normal after being lightly pinched and let go (poor skin turgor).  Changes in body fluids, such as:  Feeling very thirsty.  Your eyes making fewer tears.  Not sweating when body temperature is high, such as in hot weather.  Your body making very little pee.  Changes in vital signs, such as:  Weak pulse.  Pulse that is more than 100 beats a minute when you are sitting still.  Fast breathing.  Low blood pressure.  Other changes, such as:  Sunken eyes.  Cold hands and feet.  Confusion.  Lack of energy (lethargy).  Trouble waking up from sleep.  Short-term weight loss.  Unconsciousness. Follow these instructions at home:  If told by your doctor, drink an ORS:  Make an ORS by using instructions on the package.  Start by drinking small amounts, about  cup (120 mL) every 5-10 minutes.  Slowly drink more until you have had the amount that your doctor said to have.  Drink enough clear fluid to keep your pee clear or pale yellow. If you were told to drink an ORS, finish the  ORS first, then start slowly drinking clear fluids. Drink fluids such as:  Water. Do not drink only water by itself. Doing that can make the salt (sodium) level in your body get too low (hyponatremia).  Ice chips.  Fruit juice that you have added water to (diluted).  Low-calorie sports drinks.  Avoid:  Alcohol.  Drinks that have a lot of sugar. These include high-calorie sports drinks, fruit juice that does not have water added, and soda.  Caffeine.  Foods that are greasy or have a lot of fat or sugar.  Take over-the-counter and prescription medicines only as told by your doctor.  Do not take salt tablets. Doing that can make the salt level in your body get too high (hypernatremia).  Eat foods that have minerals (electrolytes). Examples include bananas, oranges, potatoes, tomatoes, and spinach.  Keep all follow-up visits as told by your doctor. This is important. Contact a doctor if:  You have belly (abdominal) pain that:  Gets worse.  Stays in one area (localizes).  You have a rash.  You have a stiff neck.  You get angry or annoyed more easily than normal (irritability).  You are more sleepy than normal.  You have a harder time waking up than normal.  You feel:  Weak.  Dizzy.  Very thirsty.  You have peed (urinated) only a small amount of very dark pee during 6-8 hours. Get help right away if:  You   have symptoms of very bad dehydration.  You cannot drink fluids without throwing up (vomiting).  Your symptoms get worse with treatment.  You have a fever.  You have a very bad headache.  You are throwing up or having watery poop (diarrhea) and it:  Gets worse.  Does not go away.  You have blood or something green (bile) in your throw-up.  You have blood in your poop (stool). This may cause poop to look black and tarry.  You have not peed in 6-8 hours.  You pass out (faint).  Your heart rate when you are sitting still is more than 100 beats a  minute.  You have trouble breathing. This information is not intended to replace advice given to you by your health care provider. Make sure you discuss any questions you have with your health care provider. Document Released: 08/17/2009 Document Revised: 05/10/2016 Document Reviewed: 12/15/2015 Elsevier Interactive Patient Education  2017 [employer]  

## 2016-09-24 ENCOUNTER — Telehealth: Payer: Self-pay | Admitting: *Deleted

## 2016-09-24 ENCOUNTER — Encounter: Payer: Self-pay | Admitting: Hematology and Oncology

## 2016-09-24 LAB — URINE CULTURE: Organism ID, Bacteria: NO GROWTH

## 2016-09-24 NOTE — Assessment & Plan Note (Signed)
According to his wife, he has mild worsening dyskinesia recently. He has appointment to see neurologist

## 2016-09-24 NOTE — Assessment & Plan Note (Signed)
This white noted that the patient has mild discoloration and dark urine. Per request, I will order urinalysis and urine culture to exclude urinary tract infection

## 2016-09-24 NOTE — Telephone Encounter (Signed)
Per LOS I have scheduled appts and notified the scheduler 

## 2016-09-24 NOTE — Telephone Encounter (Signed)
Attempted to call wife to let her know urine looks negative. No answer or machine

## 2016-09-24 NOTE — Assessment & Plan Note (Signed)
He is undergoing treatment with expected side-effects His wife noticed that he is getting dehydrated and has refuses adequate oral fluid intake. I will provide symptom management and will see him on a weekly basis for supportive care He appears to be benefiting from IV fluids and we will continue the same

## 2016-09-24 NOTE — Progress Notes (Signed)
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 Abnormal enhancing tissue abuts the right tongue base. Large enhancing and necrotic level 2A nodal conglomerate/mass measuring 4.7 x 2.7 x 3.3 cm, AP by transverse by CC, corresponding to the patient's hypermetabolic lesion on contemporaneous PET imaging. The mass invades the right carotid space with abnormal tissue encasing and narrowing the cervical right internal carotid artery with abnormal enhancing tissue extending to the right internal carotid artery; the artery remains grossly patent.The right external carotid artery is encased and narrowed by the enhancing mass, however, appears to remain patent. The right internal jugular vein is compressed and occluded at the level of the mass. There is a long segment nonocclusive filling defect of the left internal jugular vein inferior to the mass concerning for nonocclusive thrombus within both benign and malignant thrombus in the differential. There is a central filling defect in  the right EJ as well. Prominent rounded 9 mm right level 3 lymph node concerning for an additional site of metastatic disease as the node displays increased metabolic activity on contemporaneous PET imaging      07/05/2016 PET scan    Large hypermetabolic mass in the right neck as described above, concerning for malignancy. There is a large area of asymmetric and hypermetabolic soft tissue involving the right neck, which spans anteriorly involving the right palatine tonsil and posteriorly to the right sternocleidomastoid muscle, and measures approximately 6.3 x 4.3 cm. There is also asymmetric thickening and hypermetabolic activity within the right palatine tonsil. There are 2 adjacent lymph nodes, posterior to the right sternocleidomastoid muscle, which measure approximately 6 to 7 mm in size and demonstrate malignant range uptake of FDG. Ancillary head and neck CT findings: Intracranial atherosclerosis.Given asymmetric soft tissue thickening of the right palatine tonsil, this large mass could represent a primary lesion versus nodal disease. There are hypermetabolic lymph nodes posterior to the right sternocleidomastoid muscle concerning for nodal disease. No distant FDG avid metastatic disease in the chest, abdomen, pelvis.      07/31/2016 - 09/18/2016 Radiation Therapy    He received radiation therapy without chemotherapy due to significant co-morbidities       INTERVAL HISTORY: Please see below for problem oriented charting. He is seen in the treatment area today. He denies pain. No nausea or vomiting. He was able to tolerate on average approximately 5 cans of nutritional supplement through the feeding tube. No recent constipation. His wife noticed his recent dyskinesia is a little worse. She also noted passage of dark urine recently  REVIEW OF SYSTEMS:   Constitutional: Denies fevers, chills or abnormal  weight loss Eyes: Denies blurriness of vision Ears, nose, mouth, throat, and face:  Denies mucositis or sore throat Respiratory: Denies cough, dyspnea or wheezes Cardiovascular: Denies palpitation, chest discomfort or lower extremity swelling Gastrointestinal:  Denies nausea, heartburn or change in bowel habits Skin: Denies abnormal skin rashes Lymphatics: Denies new lymphadenopathy or easy bruising Neurological:Denies numbness, tingling or new weaknesses Behavioral/Psych: Mood is stable, no new changes  All other systems were reviewed with the patient and are negative.  I have reviewed the past medical history, past surgical history, social history and family history with the patient and they are unchanged from previous note.  ALLERGIES:  has No Known Allergies.  MEDICATIONS:  Current Outpatient Prescriptions  Medication Sig Dispense Refill  . ALPRAZolam (XANAX) 0.25 MG tablet Take one half to one whole tablet as needed for anxiety.    Marland Kitchen amitriptyline (ELAVIL) 75 MG tablet Take 75 mg by mouth.    . CARBIDOPA-LEVODOPA EN by Enteral route.    . Carbidopa-Levodopa ER (SINEMET CR) 25-100 MG tablet controlled release Take by mouth. Take one tablet by mouth nightly    . docusate sodium (COLACE) 100 MG capsule Take 100 mg by mouth 2 (two) times daily.     Marland Kitchen donepezil (ARICEPT) 5 MG tablet Take 5 mg by mouth.    Marland Kitchen guaiFENesin-dextromethorphan (ROBITUSSIN DM) 100-10 MG/5ML syrup Take 5 mLs by mouth every 4 (four) hours as needed for cough. 236 mL 4  . HYDROcodone-acetaminophen (HYCET) 7.5-325 mg/15 ml solution Take 10-11mL Q 4hrs prn pain, take with food. 473 mL 0  . lactobacillus acidophilus (BACID) TABS tablet Take 2 tablets by mouth 3 (three) times daily.    Marland Kitchen lidocaine (XYLOCAINE) 2 % solution Caregiver: Mix 1part 2% viscous lidocaine, 1part H20. Patient: Swish and swallow 63mL of this mixture, 1min before meals and QHS, up to QID 100 mL 5  . Multiple Vitamins-Minerals (MULTIVITAMIN WITH MINERALS) tablet Take 1 tablet by mouth daily.    . Nutritional Supplements (FEEDING  SUPPLEMENT, OSMOLITE 1.5 CAL,) LIQD Give 1 can Osmolite 1.5 via feeding tube every 3 hours with 60 cc free water before and after bolus. Give 240 cc free water BID. 1422 mL 0  . ondansetron (ZOFRAN) 8 MG tablet Take 1 tablet (8 mg total) by mouth every 8 (eight) hours as needed for nausea or vomiting. 60 tablet 1  . sennosides (SENOKOT) 8.8 MG/5ML syrup Take 5 mLs by mouth 2 (two) times daily. PRN constipation. 236 mL 5  . sucralfate (CARAFATE) 1 g tablet Dissolve 1 tablet in 10 mL H20 and swallow QID PRN soreness (Patient not taking: Reported on 09/16/2016) 40 tablet 5  . UNABLE TO FIND Med Name: carbidopa-levodopa pump continuous infusion for parkison's     No current facility-administered medications for this visit.     PHYSICAL EXAMINATION: ECOG PERFORMANCE STATUS: 1 - Symptomatic but completely ambulatory GENERAL:alert, no distress and comfortable SKIN: Noted redness around his neck without skin peeling or ulceration EYES: normal, Conjunctiva are pink and non-injected, sclera clear OROPHARYNX:no exudate, no erythema and lips, buccal mucosa, and tongue normal  NECK: supple, thyroid normal size, non-tender, without nodularity LYMPH:  no palpable lymphadenopathy in the cervical, axillary or inguinal LUNGS: clear to auscultation and percussion with normal breathing effort HEART: regular rate & rhythm and no murmurs and no lower extremity edema ABDOMEN:abdomen soft, non-tender and normal bowel sounds. Feeding tube site looks okay Musculoskeletal:no cyanosis of digits and no clubbing  NEURO: alert With significant dyskinesia  LABORATORY DATA:  I have reviewed the data as listed    Component Value Date/Time   NA 138 09/16/2016 1607   K 4.3 09/16/2016 1607   CO2 24 09/16/2016 1607   GLUCOSE 94 09/16/2016 1607   BUN 17.4 09/16/2016 1607   CREATININE 0.9 09/16/2016 1607   CALCIUM 9.5 09/16/2016 1607   PROT 7.2 09/02/2016 0956   ALBUMIN 3.5 09/02/2016 0956   AST 13 09/02/2016 0956    ALT <6 09/02/2016 0956   ALKPHOS 57 09/02/2016 0956   BILITOT 0.43 09/02/2016 0956    No results found for: SPEP, UPEP  Lab Results  Component Value Date   WBC 6.8 09/02/2016   NEUTROABS 5.7 09/02/2016   HGB 14.3 09/02/2016   HCT 43.6 09/02/2016   MCV 92.0 09/02/2016   PLT 220 09/02/2016      Chemistry      Component Value Date/Time   NA 138 09/16/2016 1607   K 4.3 09/16/2016 1607   CO2 24 09/16/2016 1607   BUN 17.4 09/16/2016 1607   CREATININE 0.9 09/16/2016 1607      Component Value Date/Time   CALCIUM 9.5 09/16/2016 1607   ALKPHOS 57 09/02/2016 0956   AST 13 09/02/2016 0956   ALT <6 09/02/2016 0956   BILITOT 0.43 09/02/2016 0956     ASSESSMENT & PLAN:  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 I will provide symptom management and will see him on a weekly basis for supportive care He appears to be benefiting from IV fluids and we will continue the same  Parkinson's disease [facility]) According to his wife, he has mild worsening dyskinesia recently. He has appointment to see neurologist  Protein-calorie malnutrition, moderate ([place]) He is tolerating nutritional feeding well at home. Recommend we continue to same  Dysuria This white noted that the patient has mild discoloration and dark urine. Per request, I will order urinalysis and urine culture to exclude urinary tract infection   Orders Placed This Encounter  Procedures  . Urine culture    Standing Status:   Future    Number of Occurrences:   1    Standing Expiration Date:   10/28/2017  . Urinalysis, Microscopic - [facility]    Standing Status:   Future    Number of Occurrences:   1    Standing Expiration Date:   10/28/2017   All questions were answered. The patient knows to call the clinic with any problems, questions or concerns. No barriers to learning was detected. I  spent 15 minutes counseling the patient face to face. The total time spent in the appointment was 20 minutes and more than 50% was on counseling and review of test results     Heath Lark, MD 09/24/2016 7:24 AM

## 2016-09-24 NOTE — Assessment & Plan Note (Signed)
He is tolerating nutritional feeding well at home. Recommend we continue to same

## 2016-09-25 ENCOUNTER — Telehealth: Payer: Self-pay | Admitting: *Deleted

## 2016-09-25 ENCOUNTER — Ambulatory Visit (HOSPITAL_BASED_OUTPATIENT_CLINIC_OR_DEPARTMENT_OTHER): Payer: Medicare Other

## 2016-09-25 VITALS — BP 107/70 | HR 89 | Temp 98.0°F | Resp 16

## 2016-09-25 DIAGNOSIS — C090 Malignant neoplasm of tonsillar fossa: Secondary | ICD-10-CM

## 2016-09-25 DIAGNOSIS — E860 Dehydration: Secondary | ICD-10-CM | POA: Diagnosis present

## 2016-09-25 DIAGNOSIS — R682 Dry mouth, unspecified: Secondary | ICD-10-CM

## 2016-09-25 DIAGNOSIS — K1233 Oral mucositis (ulcerative) due to radiation: Secondary | ICD-10-CM

## 2016-09-25 MED ORDER — SODIUM CHLORIDE 0.9 % IV SOLN: Freq: Once | INTRAVENOUS | Status: DC

## 2016-09-25 MED ORDER — ONDANSETRON HCL 4 MG/2ML IJ SOLN
INTRAMUSCULAR | Status: AC
  Filled 2016-09-25: qty 4

## 2016-09-25 MED ORDER — SODIUM CHLORIDE 0.9 % IV SOLN
Freq: Once | INTRAVENOUS | Status: AC
  Administered 2016-09-25: 15:00:00 via INTRAVENOUS

## 2016-09-25 MED ORDER — ONDANSETRON HCL 4 MG/2ML IJ SOLN
8.0000 mg | Freq: Once | INTRAMUSCULAR | Status: AC
  Administered 2016-09-25: 8 mg via INTRAVENOUS

## 2016-09-25 NOTE — Patient Instructions (Signed)
Dehydration, Adult Dehydration is when there is not enough fluid or water in your body. This happens when you lose more fluids than you take in. Dehydration can range from mild to very bad. It should be treated right away to keep it from getting very bad. Symptoms of mild dehydration may include:   Thirst.  Dry lips.  Slightly dry mouth.  Dry, warm skin.  Dizziness. Symptoms of moderate dehydration may include:   Very dry mouth.  Muscle cramps.  Dark pee (urine). Pee may be the color of tea.  Your body making less pee.  Your eyes making fewer tears.  Heartbeat that is uneven or faster than normal (palpitations).  Headache.  Light-headedness, especially when you stand up from sitting.  Fainting (syncope). Symptoms of very bad dehydration may include:   Changes in skin, such as:  Cold and clammy skin.  Blotchy (mottled) or pale skin.  Skin that does not quickly return to normal after being lightly pinched and let go (poor skin turgor).  Changes in body fluids, such as:  Feeling very thirsty.  Your eyes making fewer tears.  Not sweating when body temperature is high, such as in hot weather.  Your body making very little pee.  Changes in vital signs, such as:  Weak pulse.  Pulse that is more than 100 beats a minute when you are sitting still.  Fast breathing.  Low blood pressure.  Other changes, such as:  Sunken eyes.  Cold hands and feet.  Confusion.  Lack of energy (lethargy).  Trouble waking up from sleep.  Short-term weight loss.  Unconsciousness. Follow these instructions at home:  If told by your doctor, drink an ORS:  Make an ORS by using instructions on the package.  Start by drinking small amounts, about  cup (120 mL) every 5-10 minutes.  Slowly drink more until you have had the amount that your doctor said to have.  Drink enough clear fluid to keep your pee clear or pale yellow. If you were told to drink an ORS, finish the  ORS first, then start slowly drinking clear fluids. Drink fluids such as:  Water. Do not drink only water by itself. Doing that can make the salt (sodium) level in your body get too low (hyponatremia).  Ice chips.  Fruit juice that you have added water to (diluted).  Low-calorie sports drinks.  Avoid:  Alcohol.  Drinks that have a lot of sugar. These include high-calorie sports drinks, fruit juice that does not have water added, and soda.  Caffeine.  Foods that are greasy or have a lot of fat or sugar.  Take over-the-counter and prescription medicines only as told by your doctor.  Do not take salt tablets. Doing that can make the salt level in your body get too high (hypernatremia).  Eat foods that have minerals (electrolytes). Examples include bananas, oranges, potatoes, tomatoes, and spinach.  Keep all follow-up visits as told by your doctor. This is important. Contact a doctor if:  You have belly (abdominal) pain that:  Gets worse.  Stays in one area (localizes).  You have a rash.  You have a stiff neck.  You get angry or annoyed more easily than normal (irritability).  You are more sleepy than normal.  You have a harder time waking up than normal.  You feel:  Weak.  Dizzy.  Very thirsty.  You have peed (urinated) only a small amount of very dark pee during 6-8 hours. Get help right away if:  You   have symptoms of very bad dehydration.  You cannot drink fluids without throwing up (vomiting).  Your symptoms get worse with treatment.  You have a fever.  You have a very bad headache.  You are throwing up or having watery poop (diarrhea) and it:  Gets worse.  Does not go away.  You have blood or something green (bile) in your throw-up.  You have blood in your poop (stool). This may cause poop to look black and tarry.  You have not peed in 6-8 hours.  You pass out (faint).  Your heart rate when you are sitting still is more than 100 beats a  minute.  You have trouble breathing. This information is not intended to replace advice given to you by your health care provider. Make sure you discuss any questions you have with your health care provider. Document Released: 08/17/2009 Document Revised: 05/10/2016 Document Reviewed: 12/15/2015 Elsevier Interactive Patient Education  2017 [employer]  

## 2016-09-25 NOTE — Telephone Encounter (Signed)
Oncology Nurse Navigator Documentation  Spoke with Ms. Laurance Flatten, requested expansion of Mr. Mauzey appts next Tuesday morning to include follow-up with SLP Garald Balding in [facility].  She voiced understanding.  Gayleen Orem, RN, BSN, [place] at [place] [phone]

## 2016-09-27 ENCOUNTER — Ambulatory Visit (HOSPITAL_BASED_OUTPATIENT_CLINIC_OR_DEPARTMENT_OTHER): Payer: Medicare Other

## 2016-09-27 VITALS — BP 143/114 | HR 91 | Temp 98.3°F | Resp 16

## 2016-09-27 DIAGNOSIS — E860 Dehydration: Secondary | ICD-10-CM | POA: Diagnosis present

## 2016-09-27 DIAGNOSIS — K1233 Oral mucositis (ulcerative) due to radiation: Secondary | ICD-10-CM

## 2016-09-27 DIAGNOSIS — R682 Dry mouth, unspecified: Secondary | ICD-10-CM

## 2016-09-27 DIAGNOSIS — C090 Malignant neoplasm of tonsillar fossa: Secondary | ICD-10-CM | POA: Diagnosis not present

## 2016-09-27 MED ORDER — SODIUM CHLORIDE 0.9 % IV SOLN
Freq: Once | INTRAVENOUS | Status: AC
  Administered 2016-09-27: 16:00:00 via INTRAVENOUS

## 2016-09-27 NOTE — Patient Instructions (Signed)
Dehydration, Adult Dehydration is a condition in which there is not enough fluid or water in the body. This happens when you lose more fluids than you take in. Important organs, such as the kidneys, brain, and heart, cannot function without a proper amount of fluids. Any loss of fluids from the body can lead to dehydration. Dehydration can range from mild to severe. This condition should be treated right away to prevent it from becoming severe. What are the causes? This condition may be caused by:  Vomiting.  Diarrhea.  Excessive sweating, such as from heat exposure or exercise.  Not drinking enough fluid, especially:  When ill.  While doing activity that requires a lot of energy.  Excessive urination.  Fever.  Infection.  Certain medicines, such as medicines that cause the body to lose excess fluid (diuretics).  Inability to access safe drinking water.  Reduced physical ability to get adequate water and food. What increases the risk? This condition is more likely to develop in people:  Who have a poorly controlled long-term (chronic) illness, such as diabetes, heart disease, or kidney disease.  Who are age 65 or older.  Who are disabled.  Who live in a place with high altitude.  Who play endurance sports. What are the signs or symptoms? Symptoms of mild dehydration may include:   Thirst.  Dry lips.  Slightly dry mouth.  Dry, warm skin.  Dizziness. Symptoms of moderate dehydration may include:   Very dry mouth.  Muscle cramps.  Dark urine. Urine may be the color of tea.  Decreased urine production.  Decreased tear production.  Heartbeat that is irregular or faster than normal (palpitations).  Headache.  Light-headedness, especially when you stand up from a sitting position.  Fainting (syncope). Symptoms of severe dehydration may include:   Changes in skin, such as:  Cold and clammy skin.  Blotchy (mottled) or pale skin.  Skin that does  not quickly return to normal after being lightly pinched and released (poor skin turgor).  Changes in body fluids, such as:  Extreme thirst.  No tear production.  Inability to sweat when body temperature is high, such as in hot weather.  Very little urine production.  Changes in vital signs, such as:  Weak pulse.  Pulse that is more than 100 beats a minute when sitting still.  Rapid breathing.  Low blood pressure.  Other changes, such as:  Sunken eyes.  Cold hands and feet.  Confusion.  Lack of energy (lethargy).  Difficulty waking up from sleep.  Short-term weight loss.  Unconsciousness. How is this diagnosed? This condition is diagnosed based on your symptoms and a physical exam. Blood and urine tests may be done to help confirm the diagnosis. How is this treated? Treatment for this condition depends on the severity. Mild or moderate dehydration can often be treated at home. Treatment should be started right away. Do not wait until dehydration becomes severe. Severe dehydration is an emergency and it needs to be treated in a hospital. Treatment for mild dehydration may include:   Drinking more fluids.  Replacing salts and minerals in your blood (electrolytes) that you may have lost. Treatment for moderate dehydration may include:   Drinking an oral rehydration solution (ORS). This is a drink that helps you replace fluids and electrolytes (rehydrate). It can be found at pharmacies and retail stores. Treatment for severe dehydration may include:   Receiving fluids through an IV tube.  Receiving an electrolyte solution through a feeding tube that is   passed through your nose and into your stomach (nasogastric tube, or NG tube).  Correcting any abnormalities in electrolytes.  Treating the underlying cause of dehydration. Follow these instructions at home:  If directed by your health care provider, drink an ORS:  Make an ORS by following instructions on the  package.  Start by drinking small amounts, about  cup (120 mL) every 5-10 minutes.  Slowly increase how much you drink until you have taken the amount recommended by your health care provider.  Drink enough clear fluid to keep your urine clear or pale yellow. If you were told to drink an ORS, finish the ORS first, then start slowly drinking other clear fluids. Drink fluids such as:  Water. Do not drink only water. Doing that can lead to having too little salt (sodium) in the body (hyponatremia).  Ice chips.  Fruit juice that you have added water to (diluted fruit juice).  Low-calorie sports drinks.  Avoid:  Alcohol.  Drinks that contain a lot of sugar. These include high-calorie sports drinks, fruit juice that is not diluted, and soda.  Caffeine.  Foods that are greasy or contain a lot of fat or sugar.  Take over-the-counter and prescription medicines only as told by your health care provider.  Do not take sodium tablets. This can lead to having too much sodium in the body (hypernatremia).  Eat foods that contain a healthy balance of electrolytes, such as bananas, oranges, potatoes, tomatoes, and spinach.  Keep all follow-up visits as told by your health care provider. This is important. Contact a health care provider if:  You have abdominal pain that:  Gets worse.  Stays in one area (localizes).  You have a rash.  You have a stiff neck.  You are more irritable than usual.  You are sleepier or more difficult to wake up than usual.  You feel weak or dizzy.  You feel very thirsty.  You have urinated only a small amount of very dark urine over 6-8 hours. Get help right away if:  You have symptoms of severe dehydration.  You cannot drink fluids without vomiting.  Your symptoms get worse with treatment.  You have a fever.  You have a severe headache.  You have vomiting or diarrhea that:  Gets worse.  Does not go away.  You have blood or green matter  (bile) in your vomit.  You have blood in your stool. This may cause stool to look black and tarry.  You have not urinated in 6-8 hours.  You faint.  Your heart rate while sitting still is over 100 beats a minute.  You have trouble breathing. This information is not intended to replace advice given to you by your health care provider. Make sure you discuss any questions you have with your health care provider. Document Released: 10/21/2005 Document Revised: 05/17/2016 Document Reviewed: 12/15/2015 Elsevier Interactive Patient Education  2017 [employer] Inc.  

## 2016-09-30 ENCOUNTER — Telehealth: Payer: Self-pay | Admitting: *Deleted

## 2016-09-30 NOTE — Telephone Encounter (Signed)
Oncology Nurse Navigator Documentation  Spoke with patient wife, confirmed husband's attendance at tomorrow morning's [facility] [place] following start of IVF in Infusion at 0815.   Gayleen Orem, RN, BSN, [place] at [place] [phone]

## 2016-10-01 ENCOUNTER — Ambulatory Visit (HOSPITAL_BASED_OUTPATIENT_CLINIC_OR_DEPARTMENT_OTHER): Payer: Medicare Other

## 2016-10-01 ENCOUNTER — Ambulatory Visit: Payer: Medicare Other | Attending: Radiation Oncology

## 2016-10-01 ENCOUNTER — Ambulatory Visit
Admission: RE | Admit: 2016-10-01 | Discharge: 2016-10-01 | Disposition: A | Discharge status: Home / Self Care | Payer: Medicare Other | Source: Ambulatory Visit | Attending: Radiation Oncology | Admitting: Radiation Oncology

## 2016-10-01 ENCOUNTER — Ambulatory Visit: Payer: Medicare Other | Admitting: Nutrition

## 2016-10-01 ENCOUNTER — Encounter: Payer: Self-pay | Admitting: *Deleted

## 2016-10-01 VITALS — BP 92/55 | HR 81 | Temp 98.9°F | Ht 69.0 in | Wt 127.8 lb

## 2016-10-01 VITALS — BP 97/53 | HR 81 | Temp 98.9°F | Resp 16

## 2016-10-01 DIAGNOSIS — K1233 Oral mucositis (ulcerative) due to radiation: Secondary | ICD-10-CM

## 2016-10-01 DIAGNOSIS — R41841 Cognitive communication deficit: Secondary | ICD-10-CM

## 2016-10-01 DIAGNOSIS — C090 Malignant neoplasm of tonsillar fossa: Secondary | ICD-10-CM | POA: Diagnosis not present

## 2016-10-01 DIAGNOSIS — R1310 Dysphagia, unspecified: Secondary | ICD-10-CM | POA: Insufficient documentation

## 2016-10-01 DIAGNOSIS — E860 Dehydration: Secondary | ICD-10-CM | POA: Diagnosis present

## 2016-10-01 DIAGNOSIS — R682 Dry mouth, unspecified: Secondary | ICD-10-CM

## 2016-10-01 DIAGNOSIS — R471 Dysarthria and anarthria: Secondary | ICD-10-CM

## 2016-10-01 MED ORDER — HEPARIN SOD (PORK) LOCK FLUSH 100 UNIT/ML IV SOLN
250.0000 [IU] | Freq: Once | INTRAVENOUS | Status: DC | PRN
  Filled 2016-10-01: qty 5

## 2016-10-01 MED ORDER — ALTEPLASE 2 MG IJ SOLR
2.0000 mg | Freq: Once | INTRAMUSCULAR | Status: DC | PRN
  Filled 2016-10-01: qty 2

## 2016-10-01 MED ORDER — ONDANSETRON HCL 4 MG/2ML IJ SOLN
INTRAMUSCULAR | Status: AC
  Filled 2016-10-01: qty 2

## 2016-10-01 MED ORDER — ONDANSETRON HCL 4 MG/2ML IJ SOLN
8.0000 mg | Freq: Once | INTRAMUSCULAR | Status: AC
  Administered 2016-10-01: 8 mg via INTRAVENOUS

## 2016-10-01 MED ORDER — HEPARIN SOD (PORK) LOCK FLUSH 100 UNIT/ML IV SOLN
500.0000 [IU] | Freq: Once | INTRAVENOUS | Status: DC | PRN
  Filled 2016-10-01: qty 5

## 2016-10-01 MED ORDER — SODIUM CHLORIDE 0.9 % IV SOLN
Freq: Once | INTRAVENOUS | Status: AC
  Administered 2016-10-01: 09:00:00 via INTRAVENOUS

## 2016-10-01 MED ORDER — SODIUM CHLORIDE 0.9% FLUSH
10.0000 mL | INTRAVENOUS | Status: DC | PRN
  Filled 2016-10-01: qty 10

## 2016-10-01 MED ORDER — SODIUM CHLORIDE 0.9 % IV SOLN: Freq: Once | INTRAVENOUS | Status: DC

## 2016-10-01 NOTE — Progress Notes (Signed)
Nutrition follow-up completed, during Head and neck clinic.  Weight has decreased was documented as 127.8 pounds down from 135 pounds November 13. Patient is beginning to eat some soft foods. He has been refusing some bolus feedings. He has occasional nausea and vomiting. Reports BM 3 days ago. Appears patient is getting approximately 5 bottles of Osmolite 1.5 daily via bolus method with 120 cc free water before and after each feeding.  Nutrition diagnosis: Unintended weight loss continues.  Intervention: Educated patient's wife to increase Osmolite 1.5 back to 6 cans daily with 120 cc free water before and after bolus feedings. Recommended she give 1-1/2 cans at each feeding. Encouraged her to continue to provide soft foods for patient and encourage intake. Recommended continue bowel management. Encouraged adequate fluid intake.  Monitoring, evaluation, goals: Patient will tolerate oral intake plus tube feeding to minimize weight loss and promote healing.  Next visit: To be scheduled as needed.  **Disclaimer: This note was dictated with voice recognition software. Similar sounding words can inadvertently be transcribed and this note may contain transcription errors which may not have been corrected upon publication of note.**

## 2016-10-01 NOTE — Patient Instructions (Signed)
Dehydration, Adult Dehydration is a condition in which there is not enough fluid or water in the body. This happens when you lose more fluids than you take in. Important organs, such as the kidneys, brain, and heart, cannot function without a proper amount of fluids. Any loss of fluids from the body can lead to dehydration. Dehydration can range from mild to severe. This condition should be treated right away to prevent it from becoming severe. What are the causes? This condition may be caused by:  Vomiting.  Diarrhea.  Excessive sweating, such as from heat exposure or exercise.  Not drinking enough fluid, especially:  When ill.  While doing activity that requires a lot of energy.  Excessive urination.  Fever.  Infection.  Certain medicines, such as medicines that cause the body to lose excess fluid (diuretics).  Inability to access safe drinking water.  Reduced physical ability to get adequate water and food. What increases the risk? This condition is more likely to develop in people:  Who have a poorly controlled long-term (chronic) illness, such as diabetes, heart disease, or kidney disease.  Who are age 65 or older.  Who are disabled.  Who live in a place with high altitude.  Who play endurance sports. What are the signs or symptoms? Symptoms of mild dehydration may include:   Thirst.  Dry lips.  Slightly dry mouth.  Dry, warm skin.  Dizziness. Symptoms of moderate dehydration may include:   Very dry mouth.  Muscle cramps.  Dark urine. Urine may be the color of tea.  Decreased urine production.  Decreased tear production.  Heartbeat that is irregular or faster than normal (palpitations).  Headache.  Light-headedness, especially when you stand up from a sitting position.  Fainting (syncope). Symptoms of severe dehydration may include:   Changes in skin, such as:  Cold and clammy skin.  Blotchy (mottled) or pale skin.  Skin that does  not quickly return to normal after being lightly pinched and released (poor skin turgor).  Changes in body fluids, such as:  Extreme thirst.  No tear production.  Inability to sweat when body temperature is high, such as in hot weather.  Very little urine production.  Changes in vital signs, such as:  Weak pulse.  Pulse that is more than 100 beats a minute when sitting still.  Rapid breathing.  Low blood pressure.  Other changes, such as:  Sunken eyes.  Cold hands and feet.  Confusion.  Lack of energy (lethargy).  Difficulty waking up from sleep.  Short-term weight loss.  Unconsciousness. How is this diagnosed? This condition is diagnosed based on your symptoms and a physical exam. Blood and urine tests may be done to help confirm the diagnosis. How is this treated? Treatment for this condition depends on the severity. Mild or moderate dehydration can often be treated at home. Treatment should be started right away. Do not wait until dehydration becomes severe. Severe dehydration is an emergency and it needs to be treated in a hospital. Treatment for mild dehydration may include:   Drinking more fluids.  Replacing salts and minerals in your blood (electrolytes) that you may have lost. Treatment for moderate dehydration may include:   Drinking an oral rehydration solution (ORS). This is a drink that helps you replace fluids and electrolytes (rehydrate). It can be found at pharmacies and retail stores. Treatment for severe dehydration may include:   Receiving fluids through an IV tube.  Receiving an electrolyte solution through a feeding tube that is   passed through your nose and into your stomach (nasogastric tube, or NG tube).  Correcting any abnormalities in electrolytes.  Treating the underlying cause of dehydration. Follow these instructions at home:  If directed by your health care provider, drink an ORS:  Make an ORS by following instructions on the  package.  Start by drinking small amounts, about  cup (120 mL) every 5-10 minutes.  Slowly increase how much you drink until you have taken the amount recommended by your health care provider.  Drink enough clear fluid to keep your urine clear or pale yellow. If you were told to drink an ORS, finish the ORS first, then start slowly drinking other clear fluids. Drink fluids such as:  Water. Do not drink only water. Doing that can lead to having too little salt (sodium) in the body (hyponatremia).  Ice chips.  Fruit juice that you have added water to (diluted fruit juice).  Low-calorie sports drinks.  Avoid:  Alcohol.  Drinks that contain a lot of sugar. These include high-calorie sports drinks, fruit juice that is not diluted, and soda.  Caffeine.  Foods that are greasy or contain a lot of fat or sugar.  Take over-the-counter and prescription medicines only as told by your health care provider.  Do not take sodium tablets. This can lead to having too much sodium in the body (hypernatremia).  Eat foods that contain a healthy balance of electrolytes, such as bananas, oranges, potatoes, tomatoes, and spinach.  Keep all follow-up visits as told by your health care provider. This is important. Contact a health care provider if:  You have abdominal pain that:  Gets worse.  Stays in one area (localizes).  You have a rash.  You have a stiff neck.  You are more irritable than usual.  You are sleepier or more difficult to wake up than usual.  You feel weak or dizzy.  You feel very thirsty.  You have urinated only a small amount of very dark urine over 6-8 hours. Get help right away if:  You have symptoms of severe dehydration.  You cannot drink fluids without vomiting.  Your symptoms get worse with treatment.  You have a fever.  You have a severe headache.  You have vomiting or diarrhea that:  Gets worse.  Does not go away.  You have blood or green matter  (bile) in your vomit.  You have blood in your stool. This may cause stool to look black and tarry.  You have not urinated in 6-8 hours.  You faint.  Your heart rate while sitting still is over 100 beats a minute.  You have trouble breathing. This information is not intended to replace advice given to you by your health care provider. Make sure you discuss any questions you have with your health care provider. Document Released: 10/21/2005 Document Revised: 05/17/2016 Document Reviewed: 12/15/2015 Elsevier Interactive Patient Education  2017 [employer] Inc.  

## 2016-10-01 NOTE — Patient Instructions (Signed)
Swallow hard with everything, and swallow twice with everything  "Gurgly" voice means food and liquid may be near windpipe - cough or clear your throat HARD and swallow HARD when this happens

## 2016-10-01 NOTE — Therapy (Signed)
[facility] [address] [place], [place], [address] Phone: [phone]   Fax:  [phone]  Speech Language Pathology Treatment  Patient Details  Name: Nathan Melton MRN: [id number] Date of Birth: 1950-12-23 Referring Provider: Eppie Gibson MD  Encounter Date: 10/01/2016      End of Session - 10/01/16 1207    Visit Number 2   Number of Visits 3   Date for SLP Re-Evaluation 10/11/16   SLP Start Time 0940   SLP Stop Time  1010   SLP Time Calculation (min) 30 min   Activity Tolerance Patient tolerated treatment well      Past Medical History:  Diagnosis Date  . Cancer ([place])   . Dementia   . Parkinson's disease [facility])     Past Surgical History:  Procedure Laterality Date  . BACK SURGERY    . PEG PLACEMENT      There were no vitals filed for this visit.      Subjective Assessment - 10/01/16 0846    Subjective Pt still eating soft diet and thin liquids but primarily tube feeds (4-5 cans/day).   Patient is accompained by: Family member  wife   Currently in Pain? No/denies               ADULT SLP TREATMENT - 10/01/16 0850      General Information   Behavior/Cognition Cooperative;Pleasant mood     Treatment Provided   Treatment provided Dysphagia     Dysphagia Treatment   Temperature Spikes Noted No   Respiratory Status Room air   Oral Cavity - Dentition Edentulous   Treatment Methods Skilled observation;Patient/caregiver education;Compensation strategy training;Therapeutic exercise   Patient observed directly with PO's Yes   Type of PO's observed Dysphagia 1 (puree);Thin liquids   Feeding Able to feed self   Liquids provided via Cup   Pharyngeal Phase Signs & Symptoms Other (comment)  min "wet" voice 20%, cleared with cued swallow   Other treatment/comments Pt's wife stated they had not completed HEP since mid-October due to pt fatigue. SLP cued pt's wife with min verbal cues  occasionally re: exercise procedure, in order to show pt/wife how to correctly complete exercises. Pt's wife then provided appropriate and effective usual verbal and visual (mod) cues for pt to complete appropriately. SLP told pt/wife to have pt enlist double swallow as well as effortful swallow with all POs due to occasional "wet" voice with PO intake. SLP educated pt's wife on meaning behind "wet" voice and told her to have pt complete the strongest throat clear or cough possible in order to clear material from the laryngeal vestibule. SLP also told pt/wife to reinitiate the "blowing device" wife spoke about last visit, in approx 2-3 weeks. SLP provided handout of overt s/s aspiratoin PNA and wife told SLP 3 of s/s with modified independence.     Assessment / Recommendations / Plan   Plan Continue with current plan of care     Dysphagia Recommendations   Diet recommendations --  as tolerated up to Dys III/thin liquids   Liquids provided via Cup   Compensations Multiple dry swallows after each bite/sip;Effortful swallow     Progression Toward Goals   Progression toward goals Progressing toward goals          SLP Education - 10/01/16 1153    Education provided Yes   Education Details HEP, overt s/s aspriation PNA, double swallow, effortful swallow, need to complete HEP more regularly   Person(s) Educated  Patient;Spouse   Methods Explanation;Demonstration;Verbal cues;Handout   Comprehension Verbalized understanding;Returned demonstration;Verbal cues required          SLP Short Term Goals - 10/01/16 1212      SLP SHORT TERM GOAL #1   Title pt will complete HEP with usual min A   Time 1   Period --  visit   Status On-going     SLP SHORT TERM GOAL #2   Title pt/family will tell SLP 3 overt s/s aspiration PNA with modified independence   Time 1   Period --  visits   Status Achieved  revised and met 10-01-16          SLP Long Term Goals - 10/01/16 1214      SLP LONG TERM  GOAL #1   Title pt will complete HEP with occasional min A   Time 3   Period --  visits (visit number 3)   Status On-going     SLP LONG TERM GOAL #2   Title pt/family will tell SLP how keeping a food journal can A pt in returning to WFL/WNL diet   Time 3   Period --  visits   Status On-going          Plan - 10/01/16 1208    Clinical Impression Statement Pt with "wet" voice 20% of the time with applesauce/thin today. No overt s/s aspiration PNA. Wife stated she cont's completely comfortable helping pt with HEP and this is necessary. Wife demonstrated each exercise for SLP correctly and cued pt appropriately for proper completion. He would cont to beneift from skilled ST addressing safety with POs as well as completion of HEP during and following his radiation treatment.   Speech Therapy Frequency --  approx once a month   Duration --  2 more visits prior to 10-11-16   Treatment/Interventions Aspiration precaution training;Pharyngeal strengthening exercises;Diet toleration management by SLP;Compensatory techniques;Internal/external aids;SLP instruction and feedback;Patient/family education;Trials of upgraded texture/liquids   Potential to Achieve Goals Fair   Potential Considerations Severity of impairments;Previous level of function;Ability to learn/carryover information   Consulted and Agree with Plan of Care Patient;Family member/caregiver      Patient will benefit from skilled therapeutic intervention in order to improve the following deficits and impairments:   Dysphagia, unspecified type  Dysarthria and anarthria  Cognitive communication deficit    Problem List Patient Active Problem List   Diagnosis Date Noted  . Dysuria 09/23/2016  . Dehydration 08/29/2016  . Chronic nausea 08/29/2016  . Constipation 08/29/2016  . Dry mouth 08/23/2016  . Mucositis due to radiation therapy 08/23/2016  . Protein-calorie malnutrition, moderate ([place]) 08/22/2016  . Carcinoma of  tonsillar fossa ([place]) 07/22/2016  . Parkinson's disease ([place]) 07/22/2016  . 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

## 2016-10-02 ENCOUNTER — Encounter: Payer: Self-pay | Admitting: Radiation Oncology

## 2016-10-02 NOTE — Progress Notes (Addendum)
  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 He experienced some vomiting and lack of appetite during treatment. The patient received several IV fluid infusions throughout his course of treatment.  Plan: The patient has completed radiation treatment. The patient will return to radiation oncology clinic for routine followup in one month and with med/onc sooner. I advised the patient to call or return sooner if any questions or concerns arise that are related to recovery or treatment.  -----------------------------------  Eppie Gibson, MD  This document serves as a record of services personally performed by Eppie Gibson, MD. It was created on her behalf by Maryla Morrow, a trained medical scribe. The creation of this record is based on the scribe's personal observations and the provider's statements to them. This document has been checked and approved by the attending provider.

## 2016-10-03 NOTE — Progress Notes (Signed)
Met with Mr. Nathan Melton in Infusion where he had started scheduled IVF, brought him to [facility] [place].  He was accompanied by his wife.  Arrived him to Nursing, provided verbal and written overview of [facility], the clinicians who will be seeing him, encouraged him to ask questions during his time with them.  He was seen by Nutrition, SLP, and SW.  Spoke with him at end of [facility], addressed questions.  I escorted him back to Infusion where he completed fluids. He understands I can be contacted with needs/concerns.  Gayleen Orem, RN, BSN, [place] at [place] [phone]

## 2016-10-04 ENCOUNTER — Ambulatory Visit
Admission: RE | Admit: 2016-10-04 | Discharge: 2016-10-04 | Disposition: A | Discharge status: Home / Self Care | Payer: Medicare Other | Source: Ambulatory Visit | Attending: Radiation Oncology | Admitting: Radiation Oncology

## 2016-10-04 ENCOUNTER — Ambulatory Visit (HOSPITAL_BASED_OUTPATIENT_CLINIC_OR_DEPARTMENT_OTHER): Payer: Medicare Other

## 2016-10-04 VITALS — BP 99/72 | HR 87

## 2016-10-04 DIAGNOSIS — E860 Dehydration: Secondary | ICD-10-CM | POA: Diagnosis present

## 2016-10-04 DIAGNOSIS — R682 Dry mouth, unspecified: Secondary | ICD-10-CM

## 2016-10-04 DIAGNOSIS — C090 Malignant neoplasm of tonsillar fossa: Secondary | ICD-10-CM | POA: Diagnosis not present

## 2016-10-04 DIAGNOSIS — K1233 Oral mucositis (ulcerative) due to radiation: Secondary | ICD-10-CM

## 2016-10-04 MED ORDER — ONDANSETRON HCL 4 MG/2ML IJ SOLN
INTRAMUSCULAR | Status: AC
  Filled 2016-10-04: qty 4

## 2016-10-04 MED ORDER — ONDANSETRON HCL 4 MG/2ML IJ SOLN
8.0000 mg | Freq: Once | INTRAMUSCULAR | Status: AC
  Administered 2016-10-04: 8 mg via INTRAVENOUS

## 2016-10-04 MED ORDER — SODIUM CHLORIDE 0.9 % IV SOLN: Freq: Once | INTRAVENOUS | Status: DC

## 2016-10-04 MED ORDER — SODIUM CHLORIDE 0.9 % IV SOLN
Freq: Once | INTRAVENOUS | Status: AC
  Administered 2016-10-04: 14:00:00 via INTRAVENOUS

## 2016-10-07 ENCOUNTER — Telehealth: Payer: Self-pay | Admitting: *Deleted

## 2016-10-07 NOTE — Telephone Encounter (Signed)
Oncology Nurse Navigator Documentation  Received call from patient's wife, Leda Gauze.  She emotionally explained that feeding tube became "disconnected"  Saturday afternoon.  She called [facility], was told that situation would be addressed on Monday by placing physician.  When she called this morning, she was told by receptionist a physician order had not yet been placed, an appt; could not be scheduled yet.  "I don't know what to do so I'm calling you."  I checked Care Everywhere notes which noted Mrs. Laurance Flatten had called this morning, GI MD had been notified with request for follow-up guidance.  I acknowledge her concern, provided comfort.  I encouraged her to be patient for call from MD's office, noting it has been less than an hour since her last call.  I expressed confidence she will hear from them and will be provided appropriate guidance.  She seemed calmer as we completed our conversation, thanked me for my support.  Gayleen Orem, RN, BSN, [place] Oncology Nurse [place] at [place] [phone]

## 2016-10-08 ENCOUNTER — Ambulatory Visit (HOSPITAL_BASED_OUTPATIENT_CLINIC_OR_DEPARTMENT_OTHER): Payer: Medicare Other

## 2016-10-08 ENCOUNTER — Ambulatory Visit (HOSPITAL_BASED_OUTPATIENT_CLINIC_OR_DEPARTMENT_OTHER): Payer: Medicare Other | Admitting: Hematology and Oncology

## 2016-10-08 ENCOUNTER — Encounter: Payer: Self-pay | Admitting: *Deleted

## 2016-10-08 ENCOUNTER — Encounter: Payer: Self-pay | Admitting: Hematology and Oncology

## 2016-10-08 VITALS — BP 96/56 | HR 86 | Temp 98.7°F | Resp 18

## 2016-10-08 DIAGNOSIS — C090 Malignant neoplasm of tonsillar fossa: Secondary | ICD-10-CM

## 2016-10-08 DIAGNOSIS — E860 Dehydration: Secondary | ICD-10-CM

## 2016-10-08 DIAGNOSIS — E440 Moderate protein-calorie malnutrition: Secondary | ICD-10-CM | POA: Diagnosis not present

## 2016-10-08 DIAGNOSIS — R682 Dry mouth, unspecified: Secondary | ICD-10-CM

## 2016-10-08 DIAGNOSIS — K1233 Oral mucositis (ulcerative) due to radiation: Secondary | ICD-10-CM

## 2016-10-08 MED ORDER — SODIUM CHLORIDE 0.9 % IV SOLN
Freq: Once | INTRAVENOUS | Status: AC
  Administered 2016-10-08: 14:00:00 via INTRAVENOUS

## 2016-10-08 NOTE — Progress Notes (Signed)
Unable to obtain post infusion blood pressure due to patient not being able to hold still. Patient stable upon discharge.

## 2016-10-08 NOTE — Progress Notes (Signed)
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 Abnormal enhancing tissue abuts the right tongue base. Large enhancing and necrotic level 2A nodal conglomerate/mass measuring 4.7 x 2.7 x 3.3 cm, AP by transverse by CC, corresponding to the patient's hypermetabolic lesion on contemporaneous PET imaging. The mass invades the right carotid space with abnormal tissue encasing and narrowing the cervical right internal carotid artery with abnormal enhancing tissue extending to the right internal carotid artery; the artery remains grossly patent.The right external carotid artery is encased and narrowed by the enhancing mass, however, appears to remain patent. The right internal jugular vein is compressed and occluded at the level of the mass. There is a long segment nonocclusive filling defect of the left internal jugular vein inferior to the mass concerning for nonocclusive thrombus within both benign and malignant thrombus in the differential. There is a central filling defect in  the right EJ as well. Prominent rounded 9 mm right level 3 lymph node concerning for an additional site of metastatic disease as the node displays increased metabolic activity on contemporaneous PET imaging      07/05/2016 PET scan    Large hypermetabolic mass in the right neck as described above, concerning for malignancy. There is a large area of asymmetric and hypermetabolic soft tissue involving the right neck, which spans anteriorly involving the right palatine tonsil and posteriorly to the right sternocleidomastoid muscle, and measures approximately 6.3 x 4.3 cm. There is also asymmetric thickening and hypermetabolic activity within the right palatine tonsil. There are 2 adjacent lymph nodes, posterior to the right sternocleidomastoid muscle, which measure approximately 6 to 7 mm in size and demonstrate malignant range uptake of FDG. Ancillary head and neck CT findings: Intracranial atherosclerosis.Given asymmetric soft tissue thickening of the right palatine tonsil, this large mass could represent a primary lesion versus nodal disease. There are hypermetabolic lymph nodes posterior to the right sternocleidomastoid muscle concerning for nodal disease. No distant FDG avid metastatic disease in the chest, abdomen, pelvis.      07/31/2016 - 09/18/2016 Radiation Therapy    He received radiation therapy without chemotherapy due to significant co-morbidities       INTERVAL HISTORY: Please see below for problem oriented charting. The patient is seen in the infusion room. Over the weekend, his feeding tube was accidentally pulled out. That was revised yesterday and feeding resume without problems. His wife stated the patient has been eating and drinking well. No recent nausea or vomiting or constipation. He denies pain  REVIEW OF SYSTEMS:   Constitutional: Denies fevers, chills or abnormal weight loss Eyes: Denies blurriness  of vision Ears, nose, mouth, throat, and face: Denies mucositis or sore  throat Respiratory: Denies cough, dyspnea or wheezes Cardiovascular: Denies palpitation, chest discomfort or lower extremity swelling Gastrointestinal:  Denies nausea, heartburn or change in bowel habits Skin: Denies abnormal skin rashes Lymphatics: Denies new lymphadenopathy or easy bruising Neurological:Denies numbness, tingling or new weaknesses Behavioral/Psych: Mood is stable, no new changes  All other systems were reviewed with the patient and are negative.  I have reviewed the past medical history, past surgical history, social history and family history with the patient and they are unchanged from previous note.  ALLERGIES:  has No Known Allergies.  MEDICATIONS:  Current Outpatient Prescriptions  Medication Sig Dispense Refill  . ALPRAZolam (XANAX) 0.25 MG tablet Take one half to one whole tablet as needed for anxiety.    Marland Kitchen amitriptyline (ELAVIL) 75 MG tablet Take 75 mg by mouth.    . CARBIDOPA-LEVODOPA EN by Enteral route.    . Carbidopa-Levodopa ER (SINEMET CR) 25-100 MG tablet controlled release Take by mouth. Take one tablet by mouth nightly    . docusate sodium (COLACE) 100 MG capsule Take 100 mg by mouth 2 (two) times daily.     Marland Kitchen donepezil (ARICEPT) 5 MG tablet Take 5 mg by mouth.    Marland Kitchen guaiFENesin-dextromethorphan (ROBITUSSIN DM) 100-10 MG/5ML syrup Take 5 mLs by mouth every 4 (four) hours as needed for cough. 236 mL 4  . HYDROcodone-acetaminophen (HYCET) 7.5-325 mg/15 ml solution Take 10-52mL Q 4hrs prn pain, take with food. 473 mL 0  . lactobacillus acidophilus (BACID) TABS tablet Take 2 tablets by mouth 3 (three) times daily.    Marland Kitchen lidocaine (XYLOCAINE) 2 % solution Caregiver: Mix 1part 2% viscous lidocaine, 1part H20. Patient: Swish and swallow 37mL of this mixture, 70min before meals and QHS, up to QID 100 mL 5  . Multiple Vitamins-Minerals (MULTIVITAMIN WITH MINERALS) tablet Take 1 tablet by mouth daily.    . Nutritional Supplements (FEEDING SUPPLEMENT, OSMOLITE 1.5  CAL,) LIQD Give 1 can Osmolite 1.5 via feeding tube every 3 hours with 60 cc free water before and after bolus. Give 240 cc free water BID. 1422 mL 0  . ondansetron (ZOFRAN) 8 MG tablet Take 1 tablet (8 mg total) by mouth every 8 (eight) hours as needed for nausea or vomiting. 60 tablet 1  . sennosides (SENOKOT) 8.8 MG/5ML syrup Take 5 mLs by mouth 2 (two) times daily. PRN constipation. 236 mL 5  . sucralfate (CARAFATE) 1 g tablet Dissolve 1 tablet in 10 mL H20 and swallow QID PRN soreness (Patient not taking: Reported on 09/16/2016) 40 tablet 5  . UNABLE TO FIND Med Name: carbidopa-levodopa pump continuous infusion for parkison's     No current facility-administered medications for this visit.     PHYSICAL EXAMINATION: ECOG PERFORMANCE STATUS: 2 - Symptomatic, <50% confined to bed GENERAL:alert, no distress and comfortable SKIN: skin color, texture, turgor are normal, no rashes or significant lesions Musculoskeletal:no cyanosis of digits and no clubbing  NEURO: alert & oriented x 3 With persistent dyskinesia  LABORATORY DATA:  I have reviewed the data as listed    Component Value Date/Time   NA 138 09/16/2016 1607   K 4.3 09/16/2016 1607   CO2 24 09/16/2016 1607   GLUCOSE 94 09/16/2016 1607   BUN 17.4 09/16/2016 1607   CREATININE 0.9 09/16/2016 1607   CALCIUM 9.5 09/16/2016 1607   PROT 7.2 09/02/2016 0956   ALBUMIN 3.5 09/02/2016 0956   AST 13 09/02/2016 0956  ALT <6 09/02/2016 0956   ALKPHOS 57 09/02/2016 0956   BILITOT 0.43 09/02/2016 0956    No results found for: SPEP, UPEP  Lab Results  Component Value Date   WBC 6.8 09/02/2016   NEUTROABS 5.7 09/02/2016   HGB 14.3 09/02/2016   HCT 43.6 09/02/2016   MCV 92.0 09/02/2016   PLT 220 09/02/2016      Chemistry      Component Value Date/Time   NA 138 09/16/2016 1607   K 4.3 09/16/2016 1607   CO2 24 09/16/2016 1607   BUN 17.4 09/16/2016 1607   CREATININE 0.9 09/16/2016 1607      Component Value Date/Time    CALCIUM 9.5 09/16/2016 1607   ALKPHOS 57 09/02/2016 0956   AST 13 09/02/2016 0956   ALT <6 09/02/2016 0956   BILITOT 0.43 09/02/2016 0956      ASSESSMENT & PLAN:  Carcinoma of tonsillar fossa (HCC) Most of his symptoms of nausea, pain and dehydration are resolving. He will complete his last IV fluid infusion on Friday and his wife felt that she is confident that the patient will not need further supportive care. He will continue close follow-up with radiation oncologist in the near future  Dehydration Overall, his oral intake is improving. We will complete last infusion therapy on Friday. I encouraged the patient's wife to call us if she felt that he needs additional hydration therapy in the future  Protein-calorie malnutrition, moderate ([place]) His oral intake is improving. Once he is able to eat food 100% by mouth and maintain his weight, we can schedule the feeding tube to be removed   No orders of the defined types were placed in this encounter.  All questions were answered. The patient knows to call the clinic with any problems, questions or concerns. No barriers to learning was detected. I spent 10 minutes counseling the patient face to face. The total time spent in the appointment was 15 minutes and more than 50% was on counseling and review of test results     Heath Lark, MD 10/08/2016 3:05 PM

## 2016-10-08 NOTE — Assessment & Plan Note (Signed)
Most of his symptoms of nausea, pain and dehydration are resolving. He will complete his last IV fluid infusion on Friday and his wife felt that she is confident that the patient will not need further supportive care. He will continue close follow-up with radiation oncologist in the near future

## 2016-10-08 NOTE — Patient Instructions (Signed)
Dehydration, Adult Dehydration is a condition in which there is not enough fluid or water in the body. This happens when you lose more fluids than you take in. Important organs, such as the kidneys, brain, and heart, cannot function without a proper amount of fluids. Any loss of fluids from the body can lead to dehydration. Dehydration can range from mild to severe. This condition should be treated right away to prevent it from becoming severe. What are the causes? This condition may be caused by:  Vomiting.  Diarrhea.  Excessive sweating, such as from heat exposure or exercise.  Not drinking enough fluid, especially:  When ill.  While doing activity that requires a lot of energy.  Excessive urination.  Fever.  Infection.  Certain medicines, such as medicines that cause the body to lose excess fluid (diuretics).  Inability to access safe drinking water.  Reduced physical ability to get adequate water and food. What increases the risk? This condition is more likely to develop in people:  Who have a poorly controlled long-term (chronic) illness, such as diabetes, heart disease, or kidney disease.  Who are age 65 or older.  Who are disabled.  Who live in a place with high altitude.  Who play endurance sports. What are the signs or symptoms? Symptoms of mild dehydration may include:   Thirst.  Dry lips.  Slightly dry mouth.  Dry, warm skin.  Dizziness. Symptoms of moderate dehydration may include:   Very dry mouth.  Muscle cramps.  Dark urine. Urine may be the color of tea.  Decreased urine production.  Decreased tear production.  Heartbeat that is irregular or faster than normal (palpitations).  Headache.  Light-headedness, especially when you stand up from a sitting position.  Fainting (syncope). Symptoms of severe dehydration may include:   Changes in skin, such as:  Cold and clammy skin.  Blotchy (mottled) or pale skin.  Skin that does  not quickly return to normal after being lightly pinched and released (poor skin turgor).  Changes in body fluids, such as:  Extreme thirst.  No tear production.  Inability to sweat when body temperature is high, such as in hot weather.  Very little urine production.  Changes in vital signs, such as:  Weak pulse.  Pulse that is more than 100 beats a minute when sitting still.  Rapid breathing.  Low blood pressure.  Other changes, such as:  Sunken eyes.  Cold hands and feet.  Confusion.  Lack of energy (lethargy).  Difficulty waking up from sleep.  Short-term weight loss.  Unconsciousness. How is this diagnosed? This condition is diagnosed based on your symptoms and a physical exam. Blood and urine tests may be done to help confirm the diagnosis. How is this treated? Treatment for this condition depends on the severity. Mild or moderate dehydration can often be treated at home. Treatment should be started right away. Do not wait until dehydration becomes severe. Severe dehydration is an emergency and it needs to be treated in a hospital. Treatment for mild dehydration may include:   Drinking more fluids.  Replacing salts and minerals in your blood (electrolytes) that you may have lost. Treatment for moderate dehydration may include:   Drinking an oral rehydration solution (ORS). This is a drink that helps you replace fluids and electrolytes (rehydrate). It can be found at pharmacies and retail stores. Treatment for severe dehydration may include:   Receiving fluids through an IV tube.  Receiving an electrolyte solution through a feeding tube that is   passed through your nose and into your stomach (nasogastric tube, or NG tube).  Correcting any abnormalities in electrolytes.  Treating the underlying cause of dehydration. Follow these instructions at home:  If directed by your health care provider, drink an ORS:  Make an ORS by following instructions on the  package.  Start by drinking small amounts, about  cup (120 mL) every 5-10 minutes.  Slowly increase how much you drink until you have taken the amount recommended by your health care provider.  Drink enough clear fluid to keep your urine clear or pale yellow. If you were told to drink an ORS, finish the ORS first, then start slowly drinking other clear fluids. Drink fluids such as:  Water. Do not drink only water. Doing that can lead to having too little salt (sodium) in the body (hyponatremia).  Ice chips.  Fruit juice that you have added water to (diluted fruit juice).  Low-calorie sports drinks.  Avoid:  Alcohol.  Drinks that contain a lot of sugar. These include high-calorie sports drinks, fruit juice that is not diluted, and soda.  Caffeine.  Foods that are greasy or contain a lot of fat or sugar.  Take over-the-counter and prescription medicines only as told by your health care provider.  Do not take sodium tablets. This can lead to having too much sodium in the body (hypernatremia).  Eat foods that contain a healthy balance of electrolytes, such as bananas, oranges, potatoes, tomatoes, and spinach.  Keep all follow-up visits as told by your health care provider. This is important. Contact a health care provider if:  You have abdominal pain that:  Gets worse.  Stays in one area (localizes).  You have a rash.  You have a stiff neck.  You are more irritable than usual.  You are sleepier or more difficult to wake up than usual.  You feel weak or dizzy.  You feel very thirsty.  You have urinated only a small amount of very dark urine over 6-8 hours. Get help right away if:  You have symptoms of severe dehydration.  You cannot drink fluids without vomiting.  Your symptoms get worse with treatment.  You have a fever.  You have a severe headache.  You have vomiting or diarrhea that:  Gets worse.  Does not go away.  You have blood or green matter  (bile) in your vomit.  You have blood in your stool. This may cause stool to look black and tarry.  You have not urinated in 6-8 hours.  You faint.  Your heart rate while sitting still is over 100 beats a minute.  You have trouble breathing. This information is not intended to replace advice given to you by your health care provider. Make sure you discuss any questions you have with your health care provider. Document Released: 10/21/2005 Document Revised: 05/17/2016 Document Reviewed: 12/15/2015 Elsevier Interactive Patient Education  2017 [employer] Inc.  

## 2016-10-08 NOTE — Assessment & Plan Note (Signed)
Overall, his oral intake is improving. We will complete last infusion therapy on Friday. I encouraged the patient's wife to call us if she felt that he needs additional hydration therapy in the future

## 2016-10-08 NOTE — Assessment & Plan Note (Signed)
His oral intake is improving. Once he is able to eat food 100% by mouth and maintain his weight, we can schedule the feeding tube to be removed

## 2016-10-09 ENCOUNTER — Telehealth: Payer: Self-pay | Admitting: *Deleted

## 2016-10-09 NOTE — Telephone Encounter (Signed)
CALLED PATIENT TO ASK ABOUT COMING IN FOR A FU WITH DR. Isidore Moos ON 10-22-16 @ 2:30 PM, PATIENT'S WIFE, MARILYN AGREED TO THIS APPT.

## 2016-10-10 NOTE — Progress Notes (Signed)
Oncology Nurse Navigator Documentation  Met with Nathan Melton and his wife in Infusion to check on his well-being.  He was receiving scheduled IVF. Nathan Melton indicated they were seen by [facility] yesterday afternoon, feeding tube was replaced. She thanked me for my support and comfort when she called yesterday morning.  "I was at my wits end, losing it."    Gayleen Orem, RN, BSN, [place] at [place] [phone]

## 2016-10-11 ENCOUNTER — Telehealth: Payer: Self-pay | Admitting: *Deleted

## 2016-10-11 ENCOUNTER — Ambulatory Visit: Payer: Medicare Other

## 2016-10-11 NOTE — Telephone Encounter (Signed)
Oncology Nurse Navigator Documentation  In follow-up to Mrs. Jividen's [phone], called to offer an 11:00 or 11:30 appt for IVF scheduled for this afternoon.  She opted to stay home because it has begun snowing.  She indicated she will increase water instillation via PEG today and through the weekend to compensate for today's cancellation.  I informed Infusion Theatre manager.  Gayleen Orem, RN, BSN, [place] Neck Oncology Nurse [place] at [place] [phone]

## 2016-10-17 NOTE — Progress Notes (Signed)
  Mr. Springmeyer presents for follow up of radiation to his Right Tonsil completed 09/18/16  Pain issues, if any: He denies Using a feeding tube?: yes, his wife assists him with instilling osmolite daily. She bases how many cans of osmolite he gets based on his oral intake for the day. He will get between 2 and 6 cans daily. She also instills 16 ounces with each feeding that is done.  Weight changes, if any:  Wt Readings from Last 3 Encounters:  10/22/16 129 lb 6.4 oz (58.7 kg)  10/01/16 127 lb 12.8 oz (58 kg)  09/16/16 135 lb (61.2 kg)   Swallowing issues, if any: He reports being able to swalllow anything he wants, He does report a decreased appetite and taste alterations. He is receiving 32 ounces of water daily (whether through feeding tube or orally) Smoking or chewing tobacco? No Using fluoride trays daily? N/A Last ENT visit was on: Not since diagnosis Other notable issues, if any:  The skin to his neck has healed, but does have some hyperpigmentation. He plans to use the sonafine cream until it is completed and then use a Vitamin E cream.   Temp 97.8 F (36.6 C) (Oral)   Resp 20   Ht 5\' 9"  (1.753 m)   Wt 129 lb 6.4 oz (58.7 kg)   SpO2 100% Comment: room air  BMI 19.11 kg/m    Orthostatics: BP sitting 90/71, pulse 79. BP standing 115/78, pulse 103 (It is difficult to get an accurate BP at times due to movement)

## 2016-10-21 ENCOUNTER — Ambulatory Visit: Payer: Medicare Other

## 2016-10-22 ENCOUNTER — Ambulatory Visit
Admission: RE | Admit: 2016-10-22 | Discharge: 2016-10-22 | Disposition: A | Discharge status: Home / Self Care | Payer: Medicare Other | Source: Ambulatory Visit | Attending: Radiation Oncology | Admitting: Radiation Oncology

## 2016-10-22 ENCOUNTER — Encounter: Payer: Self-pay | Admitting: Radiation Oncology

## 2016-10-22 VITALS — Temp 97.8°F | Resp 20 | Ht 69.0 in | Wt 129.4 lb

## 2016-10-22 DIAGNOSIS — Z931 Gastrostomy status: Secondary | ICD-10-CM | POA: Insufficient documentation

## 2016-10-22 DIAGNOSIS — Z79899 Other long term (current) drug therapy: Secondary | ICD-10-CM | POA: Diagnosis not present

## 2016-10-22 DIAGNOSIS — C090 Malignant neoplasm of tonsillar fossa: Secondary | ICD-10-CM | POA: Diagnosis not present

## 2016-10-22 DIAGNOSIS — R634 Abnormal weight loss: Secondary | ICD-10-CM

## 2016-10-22 NOTE — Progress Notes (Signed)
Radiation Oncology         [phone]) [phone] ________________________________  Name: Nathan Melton MRN: [id number]  Date: 10/22/2016  DOB: [birth year]  Follow-Up Visit Note  Outpatient  CC: Nathan Dennis, DO  Nathan Melton, *  Diagnosis and Prior Radiotherapy:    ICD-9-CM ICD-10-CM   1. 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   Nathan Melton presents for follow up of radiation to his Right Tonsil completed 09/18/16   Pain issues, if any: He denies His wife assists him with instilling osmolite daily into his PEG tube. She bases how many cans of osmolite he gets based on his oral intake for the day. He will get between 2 and 6 cans daily. She also instills 16 ounces with each feeding that is done.  Weight changes, if any:     Wt Readings from Last 3 Encounters:  10/22/16 129 lb 6.4 oz (58.7 kg)  10/01/16 127 lb 12.8 oz (58 kg)  09/16/16 135 lb (61.2 kg)    Swallowing issues, if any: He reports being able to swalllow anything he wants, He does report a decreased appetite and taste alterations. He is receiving 32 ounces of water daily (whether through feeding tube or orally) Smoking or chewing tobacco? No Using fluoride trays daily? N/A Last ENT visit was on: Not since diagnosis He plans to use the sonafine cream until it is completed and then use a Vitamin E cream.                             ALLERGIES:  has No Known Allergies.  Meds: Current Outpatient Prescriptions  Medication Sig Dispense Refill  . amitriptyline (ELAVIL) 75 MG tablet Take 75 mg by mouth.    . CARBIDOPA-LEVODOPA EN by Enteral route.    . Carbidopa-Levodopa ER (SINEMET CR) 25-100 MG tablet controlled release Take by mouth. Take one tablet by mouth nightly    . docusate sodium (COLACE) 100 MG capsule Take 100 mg by mouth 2 (two) times daily.     Marland Kitchen donepezil (ARICEPT) 5  MG tablet Take 5 mg by mouth.    . Nutritional Supplements (FEEDING SUPPLEMENT, OSMOLITE 1.5 CAL,) LIQD Give 1 can Osmolite 1.5 via feeding tube every 3 hours with 60 cc free water before and after bolus. Give 240 cc free water BID. 1422 mL 0  . ondansetron (ZOFRAN) 8 MG tablet Take 1 tablet (8 mg total) by mouth every 8 (eight) hours as needed for nausea or vomiting. 60 tablet 1  . sennosides (SENOKOT) 8.8 MG/5ML syrup Take 5 mLs by mouth 2 (two) times daily. PRN constipation. 236 mL 5  . UNABLE TO FIND Med Name: carbidopa-levodopa pump continuous infusion for parkison's    . ALPRAZolam (XANAX) 0.25 MG tablet Take one half to one whole tablet as needed for anxiety.    Marland Kitchen guaiFENesin-dextromethorphan (ROBITUSSIN DM) 100-10 MG/5ML syrup Take 5 mLs by mouth every 4 (four) hours as needed for cough. (Patient not taking: Reported on 10/22/2016) 236 mL 4  . HYDROcodone-acetaminophen (HYCET) 7.5-325 mg/15 ml solution Take 10-19mL Q 4hrs prn pain, take with food. (Patient not taking: Reported on 10/22/2016) 473 mL 0  . lactobacillus acidophilus (BACID) TABS tablet Take 2 tablets by mouth 3 (three) times daily.    Marland Kitchen lidocaine (XYLOCAINE) 2 % solution Caregiver: Mix 1part 2%  viscous lidocaine, 1part H20. Patient: Swish and swallow 16mL of this mixture, 15min before meals and QHS, up to QID (Patient not taking: Reported on 10/22/2016) 100 mL 5  . Multiple Vitamins-Minerals (MULTIVITAMIN WITH MINERALS) tablet Take 1 tablet by mouth daily.    . sucralfate (CARAFATE) 1 g tablet Dissolve 1 tablet in 10 mL H20 and swallow QID PRN soreness (Patient not taking: Reported on 10/22/2016) 40 tablet 5   No current facility-administered medications for this encounter.     Physical Findings: The patient is in no acute distress. Patient is alert and oriented.  height is 5\' 9"  (1.753 m) and weight is 129 lb 6.4 oz (58.7 kg). His oral temperature is 97.8 F (36.6 C). His respiration is 20 and oxygen saturation is 100%. .      Skin: Dry, healing skin over neck. Neck: Fullness in right upper neck along SCM muscle, could be scar tissue, no obvious masses in neck. HEENT: No oropharyngeal tumor. Resolving mucositis, no thrush.  Lab Findings: Lab Results  Component Value Date   WBC 6.8 09/02/2016   HGB 14.3 09/02/2016   HCT 43.6 09/02/2016   MCV 92.0 09/02/2016   PLT 220 09/02/2016   Lab Results  Component Value Date   TSH 1.095 08/19/2016    Radiographic Findings: No results found.  Impression/Plan:  He is healing well, and gaining weight.  I will refer back to [facility] clinic for support as he continues to recover.  Will order restaging PET, TSH,  and f/u in late Feb or early March.  Wife and pt know to call if concerns arise before then.  _____________________________________   Eppie Gibson, MD This document serves as a record of services personally performed by Eppie Gibson, MD. It was created on her behalf by Bethann Humble, a trained medical scribe. The creation of this record is based on the scribe's personal observations and the provider's statements to them. This document has been checked and approved by the attending provider.

## 2016-11-20 ENCOUNTER — Telehealth: Payer: Self-pay | Admitting: *Deleted

## 2016-11-20 NOTE — Telephone Encounter (Signed)
Oncology Nurse Navigator Documentation  Called Mr. Shrestha to check on his well-being, confirm his attendance at next Tuesday morning's H&N [facility].  LVM asking for return call.  Gayleen Orem, RN, BSN, [place] Neck Oncology Nurse [place] at [place] [phone]

## 2016-11-21 ENCOUNTER — Telehealth: Payer: Self-pay | Admitting: *Deleted

## 2016-11-21 NOTE — Telephone Encounter (Signed)
Oncology Nurse Navigator Documentation  Spoke with patient wife to check on his well-being and to schedule him for H&N MDC. She reported:  His activity level has improved, sense of taste is returning.    Not experiencing any XRT SEs.  Nutrition and hydration all by mouth.  Weight currently 127 lbs.  She indicated she will reinstate PEG feeds if he does not increase oral intake. He is unable to attend the 11/26/2016 H&N [place] but can attend 12/17/2016.  I provided a 0930 arrival, she understands I will contact her again closer to clinic date.  Gayleen Orem, RN, BSN, [place] Neck Oncology Nurse [place] at [place] [phone]

## 2016-12-12 ENCOUNTER — Telehealth: Payer: Self-pay | Admitting: *Deleted

## 2016-12-12 NOTE — Telephone Encounter (Addendum)
Oncology Nurse Navigator Documentation  Received call from [name] wife. She reported he:  Had appt yesterday with PCP to address ongoing HAs, dizziness, episodes of "sweating that starts on his forehead to the back of his neck".  PCP ordered labs, results pending.  Has lost weight over recent weeks, now weighs 121#.    Eating/drinking by mouth but not using PEG for nutritional supplement.  I referred to most recent note 11/28 by RD Dory [place], reiterated her guidance to increase PEG feedings to 6 cans/day Osmolite 1.5 with 120 cc free water before and after feeding.    She acknowledged he may be dehydrated, will begin regime with goal of weight gain, improved hydration. She inquired about his receiving IV[facility] at [facility].  I indicated direction should be taken by his PCP, that he could make appropriate referrals/orders. She indicated she is unable to bring Mr. Siddons to H&N [place] next Tuesday morning d/t her work schedule.  She understands I will coordinate follow-up with Nutrition and SLP for an upcoming Monday afternoon. She voiced appreciation for my support, understands I can be contacted with needs/concerns.  Gayleen Orem, RN, BSN, [place]ck Oncology Nurse [place] at [place] [phone]

## 2016-12-23 ENCOUNTER — Telehealth: Payer: Self-pay | Admitting: *Deleted

## 2016-12-23 NOTE — Telephone Encounter (Signed)
CALLED PATIENT TO INFORM OF PET SCAN FOR 01-02-17 @ [facility], NO ANSWER WILL CALL LATER

## 2017-01-01 ENCOUNTER — Encounter: Payer: Self-pay | Admitting: Radiation Oncology

## 2017-01-02 ENCOUNTER — Telehealth: Payer: Self-pay | Admitting: *Deleted

## 2017-01-02 ENCOUNTER — Ambulatory Visit (HOSPITAL_COMMUNITY): Payer: Medicare Other

## 2017-01-02 NOTE — Telephone Encounter (Signed)
Called patient to ask about missing Pet and about rescheduling missed Pet, lvm for a return call

## 2017-01-02 NOTE — Telephone Encounter (Signed)
Oncology Nurse Navigator Documentation  Received call from patient wife Nathan Melton.  She stated she was unaware of PET scheduled for this morning or appts scheduled tomorrow.  I provided Radiology Scheduling phone number, encouraged her to call to arrange new appt.  In follow-up to our 2/8 phone conversation, she stated:  Instilling 4 cans Osmolite daily in addition to oral intake, weight now 125 lb (from 121).  PCP lab results indicated absence of infection but identified Fe and Vit D deficiencies.  He is now taking daily Fe and monthly Vit D supplements.  She understands appts for lab and follow-up with Dr. Isidore Moos scheduled for tomorrow will be rescheduled.  Nathan Orem, RN, BSN, [place] Neck Oncology Nurse [place] at [place] [phone]

## 2017-01-03 ENCOUNTER — Ambulatory Visit: Payer: Medicare Other

## 2017-01-03 ENCOUNTER — Ambulatory Visit
Admission: RE | Admit: 2017-01-03 | Discharge: 2017-01-03 | Disposition: A | Discharge status: Home / Self Care | Payer: Medicare Other | Source: Ambulatory Visit | Attending: Radiation Oncology | Admitting: Radiation Oncology

## 2017-01-03 HISTORY — DX: Personal history of irradiation: Z92.3

## 2017-01-22 ENCOUNTER — Telehealth: Payer: Self-pay | Admitting: *Deleted

## 2017-01-22 NOTE — Telephone Encounter (Signed)
CALLED PATIENT TO INFORM OF PET, LAB AND FU VISIT, LVM FOR A RETURN CALL

## 2017-01-30 ENCOUNTER — Ambulatory Visit (HOSPITAL_COMMUNITY): Payer: Medicare Other

## 2017-01-31 ENCOUNTER — Ambulatory Visit: Payer: Medicare Other

## 2017-01-31 ENCOUNTER — Ambulatory Visit
Admission: RE | Admit: 2017-01-31 | Discharge: 2017-01-31 | Disposition: A | Discharge status: Home / Self Care | Payer: Medicare Other | Source: Ambulatory Visit | Attending: Radiation Oncology | Admitting: Radiation Oncology

## 2017-02-03 ENCOUNTER — Encounter (HOSPITAL_COMMUNITY)
Admission: RE | Admit: 2017-02-03 | Discharge: 2017-02-03 | Disposition: A | Discharge status: Home / Self Care | Payer: Medicare Other | Source: Ambulatory Visit | Attending: Radiation Oncology | Admitting: Radiation Oncology

## 2017-02-03 DIAGNOSIS — C090 Malignant neoplasm of tonsillar fossa: Secondary | ICD-10-CM | POA: Diagnosis not present

## 2017-02-03 LAB — GLUCOSE, CAPILLARY: GLUCOSE-CAPILLARY: 100 mg/dL — AB (ref 65–99)

## 2017-02-03 MED ORDER — FLUDEOXYGLUCOSE F - 18 (FDG) INJECTION
6.4700 | Freq: Once | INTRAVENOUS | Status: AC | PRN
  Administered 2017-02-03: 6.47 via INTRAVENOUS

## 2017-02-14 ENCOUNTER — Encounter: Payer: Self-pay | Admitting: Radiation Oncology

## 2017-02-14 ENCOUNTER — Ambulatory Visit
Admission: RE | Admit: 2017-02-14 | Discharge: 2017-02-14 | Disposition: A | Discharge status: Home / Self Care | Payer: Medicare Other | Source: Ambulatory Visit | Attending: Radiation Oncology | Admitting: Radiation Oncology

## 2017-02-14 DIAGNOSIS — C099 Malignant neoplasm of tonsil, unspecified: Secondary | ICD-10-CM | POA: Insufficient documentation

## 2017-02-14 DIAGNOSIS — Z978 Presence of other specified devices: Secondary | ICD-10-CM | POA: Diagnosis not present

## 2017-02-14 DIAGNOSIS — R634 Abnormal weight loss: Secondary | ICD-10-CM

## 2017-02-14 DIAGNOSIS — Z8674 Personal history of sudden cardiac arrest: Secondary | ICD-10-CM | POA: Diagnosis not present

## 2017-02-14 DIAGNOSIS — G20 Parkinson's disease: Secondary | ICD-10-CM | POA: Insufficient documentation

## 2017-02-14 DIAGNOSIS — Z7982 Long term (current) use of aspirin: Secondary | ICD-10-CM | POA: Insufficient documentation

## 2017-02-14 DIAGNOSIS — C090 Malignant neoplasm of tonsillar fossa: Secondary | ICD-10-CM

## 2017-02-14 HISTORY — DX: Cardiac arrhythmia, unspecified: I49.9

## 2017-02-14 LAB — TSH: TSH: 2.768 m[IU]/L (ref 0.320–4.118)

## 2017-02-14 NOTE — Progress Notes (Signed)
  Nathan Melton presents for follow up of radiation completed 09/18/16 to his Right Tonsil.   Pain issues, if any: He denies pain.  Using a feeding tube?: He has a feeding tube in place, but is not using it at this time.  Weight changes, if any:  Wt Readings from Last 3 Encounters:  02/14/17 131 lb 9.6 oz (59.7 kg)  10/22/16 129 lb 6.4 oz (58.7 kg)  10/01/16 127 lb 12.8 oz (58 kg)   Swallowing issues, if any: He denies any difficulty swallowing. He does not need to modify his diet.  Smoking or chewing tobacco? He denies Using fluoride trays daily? He has no teeth.  Last ENT visit was on: Not since diagnosis.  Other notable issues, if any: He has had difficulty stabilizing his parkinson's medicine which has occupied his time recentlly.   BP 104/61   Pulse 78   Temp 98 F (36.7 C)   Ht 5\' 9"  (1.753 m)   Wt 131 lb 9.6 oz (59.7 kg)   SpO2 96% Comment: room air  BMI 19.43 kg/m

## 2017-02-14 NOTE — Progress Notes (Signed)
Radiation Oncology         [phone]) [phone] ________________________________  Name: Nathan Melton MRN: [id number]  Date: 02/14/2017  DOB: 22-May-1951  Follow-Up Visit Note  Outpatient  CC: Haynes Hoehn, NP  Emelda Fear, DO  Diagnosis and Prior Radiotherapy:    ICD-9-CM ICD-10-CM   1. 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  The patient had a PET scan on 02/03/17, which showed appearance compatible with improved disease, with the previous right palatine tonsillar and right sternocleidomastoid muscle mass markedly improved in size and presumable metabolic activity. Currently in this region there is only a 1.6 cm station IIa lymph node; previous reports indicated a 6.3 x 4.3 cm mass in this vicinity. Also noted are benign-appearing fractures of the left anterior second, fourth, fifth, and sixth ribs with metabolic activity compatible with benign fractures.   The PET scan shows some bright activity in prostate, and the patient reports difficulty urinating, so he is going to see a urologist in the near future.  On review of systems, the patient denies pain. He has a feeding tube in place, but reports he does not use it at this time. He denies any difficulty swallowing, and he does not modify his diet. He has gained approximately 4 lbs since November 2017. The patient reports intermittent episodes of diaphoresis. The patient is not using tobacco products at this time. He has not had an ENT visit since his diagnosis. The patient does report he has had difficulty stabilizing his Parkinson's medication recently.   The patient's wife reports the patient has been having trouble urinating and is scheduled to see a urologist in [place]. She is unsure of the physician's name at this time. Additionally, she  reports the patient's PCP has prescribed a medication for his thyroid as his TSH has been high recently. He will start this tomorrow, 02/15/17.  Wife reports cardiac arrest at [facility] outpatient appt since completing RT - since recovered.                           ALLERGIES:  has No Known Allergies.  Meds: Current Outpatient Prescriptions  Medication Sig Dispense Refill  . ALPRAZolam (XANAX) 0.25 MG tablet Take one half to one whole tablet as needed for anxiety.    Marland Kitchen aspirin EC 81 MG tablet Take 81 mg by mouth.    . CARBIDOPA-LEVODOPA EN by Enteral route.    . Carbidopa-Levodopa ER (SINEMET CR) 25-100 MG tablet controlled release Take by mouth. Take one tablet by mouth nightly    . diltiazem (CARDIZEM CD) 120 MG 24 hr capsule Take 120 mg by mouth.    . donepezil (ARICEPT) 5 MG tablet Take 5 mg by mouth.    . Ferrous Sulfate (FERATAB PO) Take by mouth every morning.    . Multiple Vitamins-Minerals (MULTIVITAMIN WITH MINERALS) tablet Take 1 tablet by mouth daily.    . ondansetron (ZOFRAN) 8 MG tablet Take 1 tablet (8 mg total) by mouth every 8 (eight) hours as needed for nausea or vomiting. 60 tablet 1  . UNABLE TO FIND Med Name: carbidopa-levodopa pump continuous infusion for parkison's    . docusate sodium (COLACE) 100 MG capsule Take 100 mg by mouth 2 (two) times daily.     Marland Kitchen guaiFENesin-dextromethorphan (ROBITUSSIN DM)  100-10 MG/5ML syrup Take 5 mLs by mouth every 4 (four) hours as needed for cough. (Patient not taking: Reported on 10/22/2016) 236 mL 4  . HYDROcodone-acetaminophen (HYCET) 7.5-325 mg/15 ml solution Take 10-22mL Q 4hrs prn pain, take with food. (Patient not taking: Reported on 10/22/2016) 473 mL 0  . lidocaine (XYLOCAINE) 2 % solution Caregiver: Mix 1part 2% viscous lidocaine, 1part H20. Patient: Swish and swallow 14mL of this mixture, 55min before meals and QHS, up to QID (Patient not taking: Reported on 10/22/2016) 100 mL 5  . Nutritional Supplements (FEEDING SUPPLEMENT,  OSMOLITE 1.5 CAL,) LIQD Give 1 can Osmolite 1.5 via feeding tube every 3 hours with 60 cc free water before and after bolus. Give 240 cc free water BID. (Patient not taking: Reported on 02/14/2017) 1422 mL 0  . sennosides (SENOKOT) 8.8 MG/5ML syrup Take 5 mLs by mouth 2 (two) times daily. PRN constipation. (Patient not taking: Reported on 02/14/2017) 236 mL 5  . sucralfate (CARAFATE) 1 g tablet Dissolve 1 tablet in 10 mL H20 and swallow QID PRN soreness (Patient not taking: Reported on 10/22/2016) 40 tablet 5   No current facility-administered medications for this encounter.     Physical Findings: The patient is in no acute distress. Patient is alert and oriented.  height is 5\' 9"  (1.753 m) and weight is 131 lb 9.6 oz (59.7 kg). His temperature is 98 F (36.7 C). His blood pressure is 104/61 and his pulse is 78. His oxygen saturation is 96%.    HEENT: Mucous membranes are dry. I do not see any tumor in the oropharynx. He has a brisk gag reflex. Edentulous. Skin: Healed very nicely over the neck. Neck: Some masslike thickening around the right angle of the mandible, level 2 region of the neck, approximately 2 cm in greatest dimension. No other palpable masses appreciated. Chest: Lungs are clear to auscultation bilaterally. Heart: Heart is regular in rate and rhythm. Neuro: ++ involuntary movements  Lab Findings: Lab Results  Component Value Date   WBC 6.8 09/02/2016   HGB 14.3 09/02/2016   HCT 43.6 09/02/2016   MCV 92.0 09/02/2016   PLT 220 09/02/2016   Lab Results  Component Value Date   TSH 2.768 02/14/2017    Radiographic Findings: Nm Pet Image Restag (ps) Skull Base To Thigh  Addendum Date: 02/03/2017   ADDENDUM REPORT: 02/03/2017 14:36 ADDENDUM: The original report was by Dr. Van Clines. The following addendum is by Dr. Van Clines: There is one further impression to add: 5. A 0.6 by 0.5 cm right lower lobe pulmonary nodule along the hemidiaphragm is not appreciably  hypermetabolic but is below sensitive PET-CT size thresholds, and may warrant surveillance. Electronically Signed   By: Van Clines M.D.   On: 02/03/2017 14:36   Result Date: 02/03/2017 CLINICAL DATA:  Subsequent treatment strategy for tonsillar cancer. EXAM: NUCLEAR MEDICINE PET SKULL BASE TO THIGH TECHNIQUE: 6.5 mCi F-18 FDG was injected intravenously. Full-ring PET imaging was performed from the skull base to thigh after the radiotracer. CT data was obtained and used for attenuation correction and anatomic localization. FASTING BLOOD GLUCOSE:  Value: 100 mg/dl COMPARISON:  Report from prior PET[facility] from [facility] dated 07/05/2016, showing a large right neck hypermetabolic mass, right palatine tonsillar thickening, and lack of distant metastatic disease in the chest, abdomen, or pelvis. FINDINGS: NECK Indistinctly marginated right station IIa lymph node measuring approximately 1.6 cm in short axis with maximum SUV 5.2. Currently no hypermetabolic pole teen or lingual tonsillar mass  is identified. There is perhaps minimal thickening of the right side of the epiglottis although this is questionable and no overt effacement of the right vallecula. No additional hypermetabolic activity in the neck. CHEST Coronary and aortic arch atherosclerotic calcification. Minimal right apical pleuroparenchymal scarring is asymmetric but not hypermetabolic. Scarring or atelectasis in the posterior basal segment right lower lobe is not hypermetabolic. 0.6 by 0.5 cm right lower lobe nodule along the hemidiaphragm, image 63/8, not appreciably hypermetabolic but below sensitive PET-CT size thresholds. ABDOMEN/PELVIS Hepatic cysts are photopenic. Peg tube with GJ extension, the GJ extension tubing is coiled once in the duodenum and appears to terminate in the proximal duodenum. Prominent stool throughout the colon favors constipation. Enlarged prostate gland, volume approximately 65 cubic cm, with some mild accentuation of  metabolic activity along the right central zone apex with maximum SUV 6.4. This is slightly eccentric to the right but a component of this could conceivably be from hypermetabolic urine in the prostatic urethra. Mildly accentuated activity along the external anal margin, probably inflammatory. Hypodense left kidney upper pole lesion does not appear hypermetabolic and accordingly is likely benign. SKELETON There fractures of the anterior left second, fourth, fifth, and sixth ribs which appears subacute. These rib fractures are lined up and lie likely posttraumatic rather than pathologic. They all demonstrate some hypermetabolic activity, most prominently in the second rib, but again are thought to be benign. IMPRESSION: 1. Appearance compatible with improved disease, with the previous right palatine tonsillar and right sternocleidomastoid muscle mass markedly improved in size and presumably in metabolic activity. Currently in this region there is only a 1.6 cm station IIa lymph node measuring 1.6 cm in short axis with maximum SUV 5.2. The previous outside report indicates a 6.3 by 4.3 cm mass in this vicinity. 2. There are benign-appearing fractures of the left anterior second, fourth, fifth, and sixth ribs with metabolic activity compatible with benign fractures. 3. Enlarged prostate gland with some accentuated metabolic activity along the right apical central zone. Correlate with PSA level. 4. Other imaging findings of potential clinical significance: Hepatic and left renal cysts. Coronary atherosclerosis. Aortoiliac atherosclerotic vascular disease. Electronically Signed: By: Van Clines M.D. On: 02/03/2017 14:03    Impression/Plan:  He is healing well, and gaining weight. Overall good response to RT alone. Residual mass (improved) may be scar tissue vs active disease.  I reviewed the recent PET scan results with the patient and his wife. We discussed the option for active surveillance with a repeat  scan in 3 months vs biopsy. She favors active surveillance given other medical issues.  The patient will see Dr. Conley Canal in approximately 6 weeks (I emailed his team to update them and arrange this appt), and follow up with me in 3 months with neck CT. Wife and patient know to call if concerns arise before then.   I have requested that the patient or his wife call Gayleen Orem, RN at [phone] with the name and location of the urologist the patient is going to see so that we can provide any pertinent information.  Additionally, they will update Liliane Channel on the specific thyroid supplement the patient has been prescribed via [facility]. TSH WNL today.  Lab Results  Component Value Date   TSH 2.768 02/14/2017    We will try to digitize and send the PET images to Dr. Conley Canal so that he can review the images.    _____________________________________   Eppie Gibson, MD  This document serves as a record of  services personally performed by Eppie Gibson, MD. It was created on her behalf by Maryla Morrow, a trained medical scribe. The creation of this record is based on the scribe's personal observations and the provider's statements to them. This document has been checked and approved by the attending provider.

## 2017-03-11 ENCOUNTER — Other Ambulatory Visit: Payer: Self-pay | Admitting: Radiation Oncology

## 2017-03-11 DIAGNOSIS — C090 Malignant neoplasm of tonsillar fossa: Secondary | ICD-10-CM

## 2017-04-16 ENCOUNTER — Telehealth: Payer: Self-pay | Admitting: *Deleted

## 2017-04-16 ENCOUNTER — Other Ambulatory Visit: Payer: Self-pay | Admitting: *Deleted

## 2017-04-16 DIAGNOSIS — C090 Malignant neoplasm of tonsillar fossa: Secondary | ICD-10-CM

## 2017-04-16 NOTE — Telephone Encounter (Signed)
Oncology Nurse Navigator Documentation  Received call from patient wife who indicated patient needs CT neck prior to 6/18 appt with Dr. Conley Canal, [facility].  I confirmed with [place] CT Neck w/ Contrast requested.  Arranged for CT Neck w/ Contrast and CT Chest Contrast at [facility] 6/14 0800 with 0745 arrival, BUN and Creatinine [place] Na lab this afternoon with arrival before 5:00.  LVMM 1410 on home phone and patient's mobile with this information, requested call back to confirm messaged receipt.  Gayleen Orem, RN, BSN, [place] Neck Oncology Nurse [place] at [place] [phone]

## 2017-04-17 ENCOUNTER — Ambulatory Visit (HOSPITAL_COMMUNITY): Admission: RE | Admit: 2017-04-17 | Payer: Medicare Other | Source: Ambulatory Visit

## 2017-04-28 ENCOUNTER — Ambulatory Visit
Admission: RE | Admit: 2017-04-28 | Discharge: 2017-04-28 | Disposition: A | Discharge status: Home / Self Care | Payer: Medicare Other | Source: Ambulatory Visit | Attending: Radiation Oncology | Admitting: Radiation Oncology

## 2017-04-28 ENCOUNTER — Ambulatory Visit: Payer: Medicare Other

## 2017-04-28 DIAGNOSIS — C090 Malignant neoplasm of tonsillar fossa: Secondary | ICD-10-CM

## 2017-04-28 LAB — BUN AND CREATININE (CC13)
BUN: 11.5 mg/dL (ref 7.0–26.0)
Creatinine: 0.8 mg/dL (ref 0.7–1.3)
EGFR: >90 ml/min/1.73 m2

## 2017-04-29 ENCOUNTER — Ambulatory Visit (HOSPITAL_COMMUNITY)
Admission: RE | Admit: 2017-04-29 | Discharge: 2017-04-29 | Disposition: A | Discharge status: Home / Self Care | Payer: Medicare Other | Source: Ambulatory Visit | Attending: Radiation Oncology | Admitting: Radiation Oncology

## 2017-04-29 ENCOUNTER — Ambulatory Visit (HOSPITAL_COMMUNITY): Payer: Medicare Other

## 2017-04-29 ENCOUNTER — Encounter (HOSPITAL_COMMUNITY): Payer: Self-pay

## 2017-04-29 DIAGNOSIS — L040 Acute lymphadenitis of face, head and neck: Secondary | ICD-10-CM | POA: Diagnosis not present

## 2017-04-29 DIAGNOSIS — I700 Atherosclerosis of aorta: Secondary | ICD-10-CM | POA: Insufficient documentation

## 2017-04-29 DIAGNOSIS — C090 Malignant neoplasm of tonsillar fossa: Secondary | ICD-10-CM

## 2017-04-29 DIAGNOSIS — J392 Other diseases of pharynx: Secondary | ICD-10-CM | POA: Diagnosis not present

## 2017-04-29 DIAGNOSIS — I82C11 Acute embolism and thrombosis of right internal jugular vein: Secondary | ICD-10-CM | POA: Insufficient documentation

## 2017-04-29 DIAGNOSIS — I2510 Atherosclerotic heart disease of native coronary artery without angina pectoris: Secondary | ICD-10-CM | POA: Insufficient documentation

## 2017-04-29 MED ORDER — IOPAMIDOL (ISOVUE-300) INJECTION 61%
INTRAVENOUS | Status: AC
  Administered 2017-04-29: 75 mL via INTRAVENOUS
  Filled 2017-04-29: qty 100

## 2017-04-29 MED ORDER — IOPAMIDOL (ISOVUE-300) INJECTION 61%
100.0000 mL | Freq: Once | INTRAVENOUS | Status: AC | PRN
  Administered 2017-04-29: 75 mL via INTRAVENOUS

## 2017-05-06 ENCOUNTER — Telehealth: Payer: Self-pay | Admitting: *Deleted

## 2017-05-06 NOTE — Telephone Encounter (Signed)
Called patient to inform of CT for 05-15-17- arrival time - 11:15 am @ [facility], pt. To have clear liquids only four hrs. prior to this test, and pt. to get test results from Dr. Isidore Moos on 05-16-17, lvm for a return call

## 2017-05-15 ENCOUNTER — Ambulatory Visit (HOSPITAL_COMMUNITY): Payer: Medicare Other

## 2017-05-16 ENCOUNTER — Ambulatory Visit: Admission: RE | Admit: 2017-05-16 | Payer: Medicare Other | Source: Ambulatory Visit | Admitting: Radiation Oncology

## 2017-05-20 ENCOUNTER — Telehealth: Payer: Self-pay | Admitting: *Deleted

## 2017-05-20 NOTE — Telephone Encounter (Signed)
Oncology Nurse Navigator Documentation  Called Nathan Melton's wife.  She confirmed they had appt yesterday with Dr. Conley Canal [facility] which included discussion of 04/29/17 CT Neck and concerning findings.  She indicated Dr. Conley Canal requested she arrange staging PET for further evaluation; Nathan Melton indicated preference for PET to be done at [facility]  In absence of documentation of PET request in Care Everywhere, I requested verification from [place] before coordinating scan.  Gayleen Orem, RN, BSN, [place] Neck Oncology Nurse [place] at [place] [phone]

## 2017-05-21 ENCOUNTER — Other Ambulatory Visit: Payer: Self-pay | Admitting: *Deleted

## 2017-05-21 DIAGNOSIS — D490 Neoplasm of unspecified behavior of digestive system: Secondary | ICD-10-CM

## 2017-05-22 ENCOUNTER — Telehealth: Payer: Self-pay | Admitting: *Deleted

## 2017-05-22 NOTE — Telephone Encounter (Signed)
Oncology Nurse Navigator Documentation  Subsequent to yesterday's receipt of PET order from Dr. Conley Canal, [facility], and confirmation of availability/location preference from patient wife, called her to inform of 7/24 0730 PET at [facility] with 0700 arrival, NPO status 6-hrs prior.  She voiced understanding.  Dr. Jenne [facility] office notified by email.  Gayleen Orem, RN, BSN, [place] Neck Oncology Nurse [place] at [place] [phone]

## 2017-05-27 ENCOUNTER — Ambulatory Visit
Admission: RE | Admit: 2017-05-27 | Discharge: 2017-05-27 | Disposition: A | Discharge status: Home / Self Care | Payer: Medicare Other | Source: Ambulatory Visit | Attending: Otolaryngology | Admitting: Otolaryngology

## 2017-05-27 DIAGNOSIS — L040 Acute lymphadenitis of face, head and neck: Secondary | ICD-10-CM | POA: Diagnosis not present

## 2017-05-27 DIAGNOSIS — D490 Neoplasm of unspecified behavior of digestive system: Secondary | ICD-10-CM | POA: Diagnosis not present

## 2017-05-27 DIAGNOSIS — N400 Enlarged prostate without lower urinary tract symptoms: Secondary | ICD-10-CM | POA: Diagnosis not present

## 2017-05-27 LAB — GLUCOSE, CAPILLARY: GLUCOSE-CAPILLARY: 79 mg/dL (ref 65–99)

## 2017-05-27 MED ORDER — FLUDEOXYGLUCOSE F - 18 (FDG) INJECTION
12.4900 | Freq: Once | INTRAVENOUS | Status: AC | PRN
  Administered 2017-05-27: 12.49 via INTRAVENOUS

## 2017-05-29 ENCOUNTER — Telehealth: Payer: Self-pay | Admitting: *Deleted

## 2017-05-29 NOTE — Telephone Encounter (Signed)
Oncology Nurse Navigator Documentation  Per previous arrangements, report for patient's 7/24 PET faxed to [facility]/Dr. Conley Canal, receipt confirmation received 1152; request for imaging pushed to PACS confirmed complete.  Gayleen Orem, RN, BSN, [place] Neck Oncology Nurse [place] at [place] [phone]

## 2017-06-03 ENCOUNTER — Ambulatory Visit (HOSPITAL_COMMUNITY): Payer: Medicare Other

## 2018-01-19 NOTE — Therapy (Signed)
[facility] [address] [place], [place], [address] Phone: [phone]   Fax:  [phone]  Patient Details  Name: Omarr Hann MRN: [id number] Date of Birth: 26-Nov-1950 Referring Provider:  No ref. provider found  Encounter Date: 01/19/2018  SPEECH THERAPY DISCHARGE SUMMARY  Visits from Start of Care: 2  Current functional level related to goals / functional outcomes: Pt's last visit was in Nov 2017, at which time he did not return to [place]. Goals at that time were as follows:  SLP Short Term Goals - 10/01/16 1212              SLP SHORT TERM GOAL #1    Title pt will complete HEP with usual min A    Time 1    Period --  visit    Status On-going         SLP SHORT TERM GOAL #2    Title pt/family will tell SLP 3 overt s/s aspiration PNA with modified independence    Time 1    Period --  visits    Status Achieved  revised and met 10-01-16                       SLP Long Term Goals - 10/01/16 1214              SLP LONG TERM GOAL #1    Title pt will complete HEP with occasional min A    Time 3    Period --  visits (visit number 3)    Status On-going         SLP LONG TERM GOAL #2    Title pt/family will tell SLP how keeping a food journal can A pt in returning to WFL/WNL diet    Time 3    Period --  visits    Status On-going     Remaining deficits: Assumed that deficits remain.   Education / Equipment: HEP, late effects head/neck radiation.  Plan: Patient agrees to discharge.  Patient goals were partially met. Patient is being discharged due to not returning since the last visit.  ?????Assumed pt is pleased with current progress.       [facility] ,[place], [place]  01/19/2018, 2:41 PM  [facility] [address] [place], [place], [address] Phone: [phone]   Fax:  [phone]

## 2018-01-28 ENCOUNTER — Telehealth: Payer: Self-pay | Admitting: *Deleted

## 2018-01-28 NOTE — Telephone Encounter (Signed)
Oncology Nurse Navigator Documentation  Tried multiple times to reach pt/pt wife to obtain update.  Listed number for pt not working number.  Persistent busy signal when calling wife.  Sent wife e-mail asking her to contact me.  Gayleen Orem, RN, BSN Head & Neck Oncology Nurse [place] at [place] [phone]

## 2018-01-31 ENCOUNTER — Telehealth: Payer: Self-pay | Admitting: *Deleted

## 2018-01-31 NOTE — Telephone Encounter (Signed)
Oncology Nurse Navigator Documentation  Attempted unsuccessfully again to contact patient and his wife at phone numbers listed.  Sent email to wife to adjusted address suspecting listed address mispelled.  Also sent email to [facility] providers requesting contact information listed in their system.  Gayleen Orem, RN, BSN Head & Neck Oncology Nurse [place] at [place] [phone]

## 2018-02-02 ENCOUNTER — Telehealth: Payer: Self-pay | Admitting: *Deleted

## 2018-02-02 NOTE — Telephone Encounter (Signed)
Oncology Nurse Navigator Documentation  Able to LVMM on pt wife's phone, requested call-back re scheduling of follow-up appt with Dr. Isidore Moos.  Gayleen Orem, RN, BSN Head & Neck Oncology Nurse [place] at [place] [phone]

## 2018-02-05 ENCOUNTER — Encounter: Payer: Self-pay | Admitting: Radiation Oncology

## 2018-02-05 ENCOUNTER — Encounter: Payer: Self-pay | Admitting: *Deleted

## 2018-02-05 NOTE — Progress Notes (Signed)
Oncology Nurse Navigator Documentation  Return call/e-mail not received from pt's wife re scheduing f/u appt with Dr. Isidore Moos.  Letter sent by RadOnc Nicanor Alcon requesting call to schedule.  Gayleen Orem, RN, BSN Head & Neck Oncology Nurse [place] at [place] [phone]

## 2018-12-23 IMAGING — CT NM PET TUM IMG INITIAL (PI) SKULL BASE T - THIGH
10 series · 24 of 25 positions shown · non-contrast
Comparison: Multiple exams, including PET-CT from 02/03/2017 and
neck CT from 04/30/2017

CLINICAL DATA: Subsequent treatment strategy for oropharyngeal
malignancy..

EXAM:
NUCLEAR MEDICINE PET SKULL BASE TO THIGH
TECHNIQUE: 12.5 mCi F-18 FDG was injected intravenously. Full-ring PET imaging
was performed from the skull base to thigh after the radiotracer. CT
data was obtained and used for attenuation correction and anatomic
localization.
FASTING BLOOD GLUCOSE:  Value: 79 mg/dl

[Series 3: ct wb 5.0 b30f · axial · 5.0mm · 0.98mm/px · z∈[-1550,-566]mm · 3 of 329 slices shown]
[im 1/329]
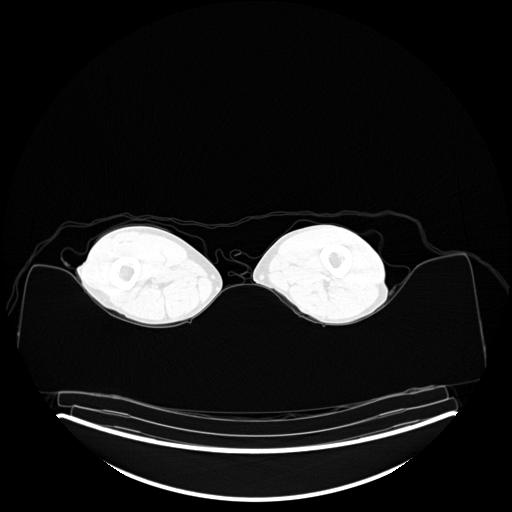
[im 165/329]
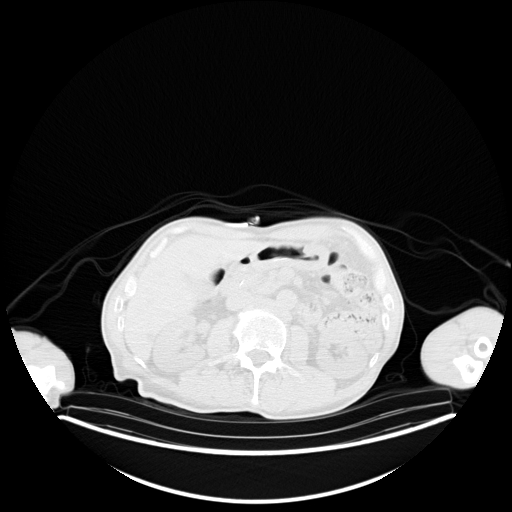
[im 329/329  brain]
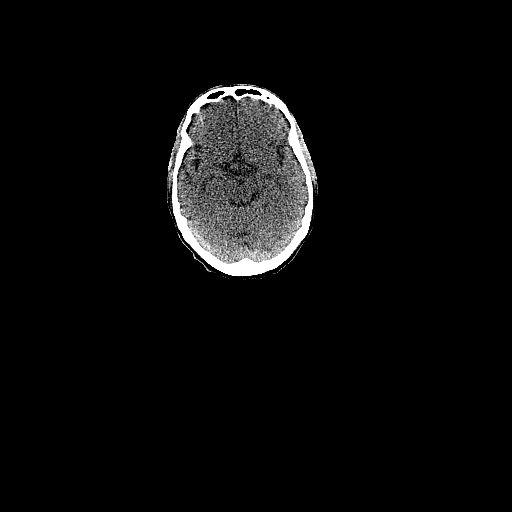

[Series 4: pet wb (ac) · axial · 5.0mm · 2.55mm/px · z∈[-1550,-566]mm · 3 of 329 slices shown]
[im 1/329]
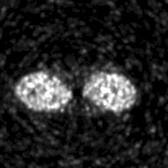
[im 165/329]
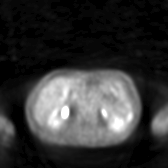
[im 329/329]
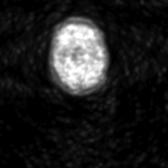

[Series 5: pet wb uncorrected (nac) · axial · 5.0mm · 4.07mm/px · z∈[-1550,-566]mm · 4 of 329 slices shown]
[im 1/329]
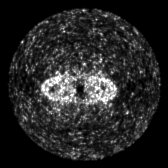
[im 110/329]
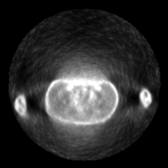
[im 219/329]
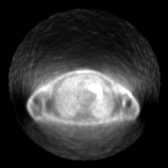
[im 329/329]
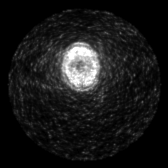

[Series 603: fused axial · 3 of 325 slices shown]
[im 1/325]
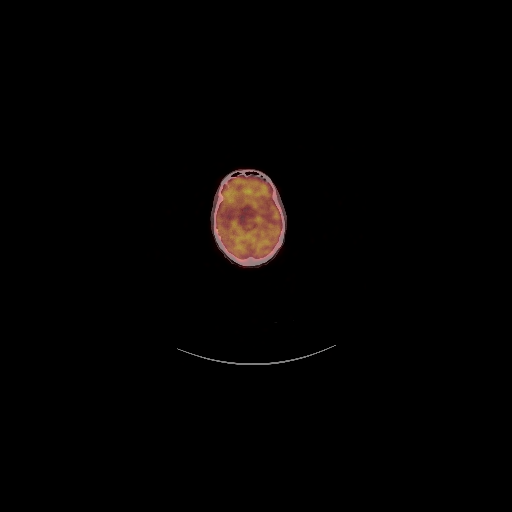
[im 109/325]
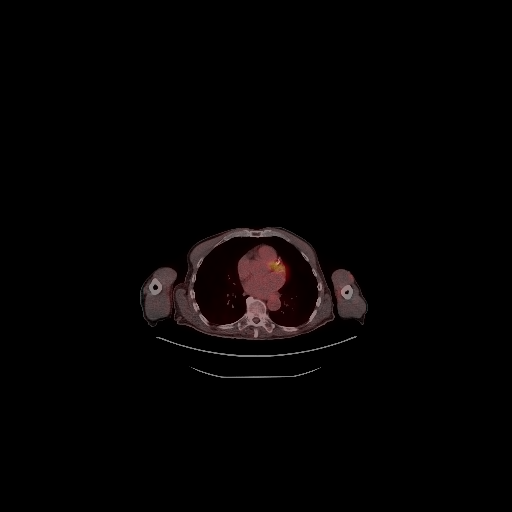
[im 325/325]
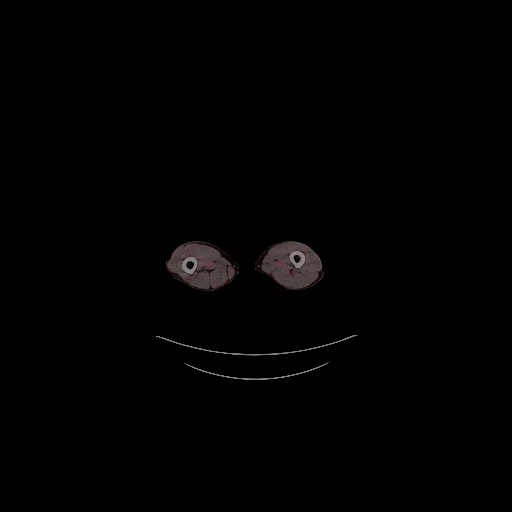

[Series 604: fused coronal · 1 of 85 slices shown]
[im 1/85]
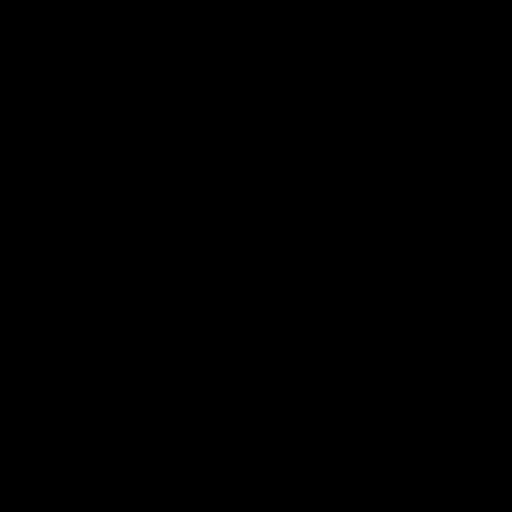

[Series 605: fused sagittal · 2 of 141 slices shown]
[im 1/141]
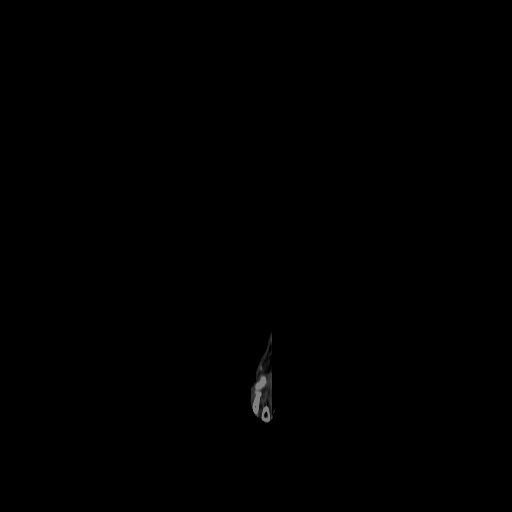
[im 141/141]
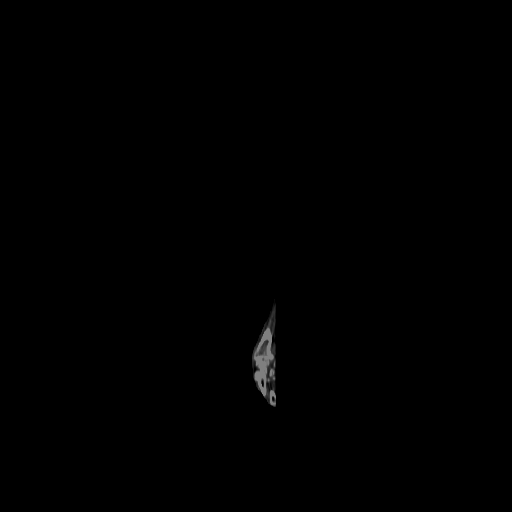

[Series 606: pet axial · 4 of 328 slices shown]
[im 1/328]
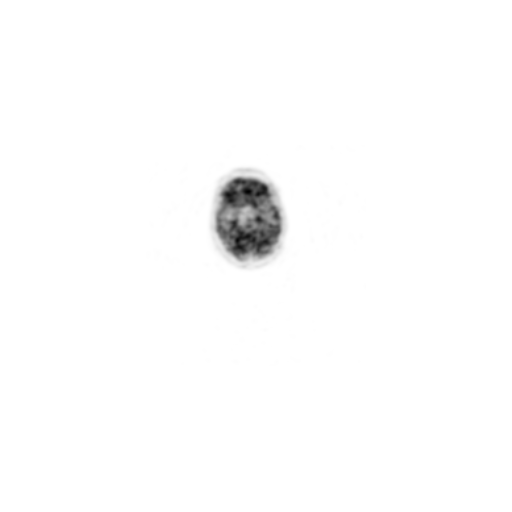
[im 110/328]
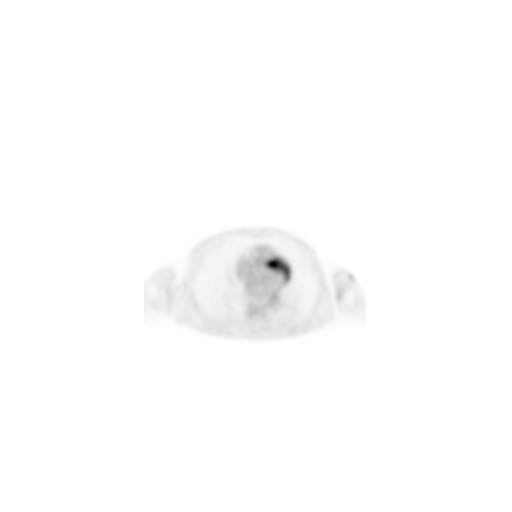
[im 219/328]
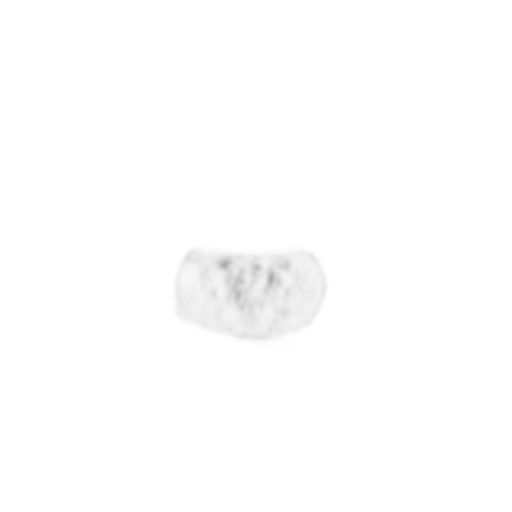
[im 328/328]
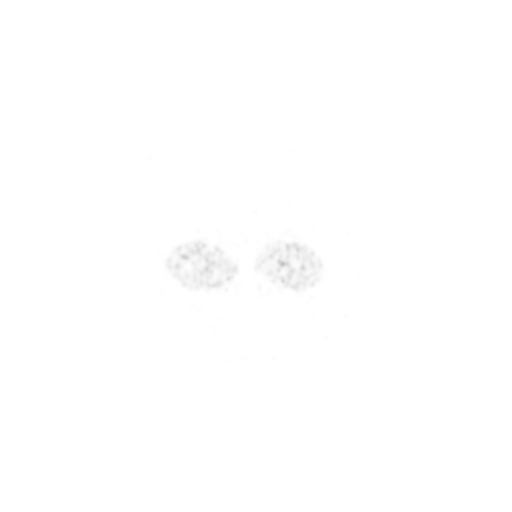

[Series 607: pet coronal · 1 of 119 slices shown]
[im 1/119]
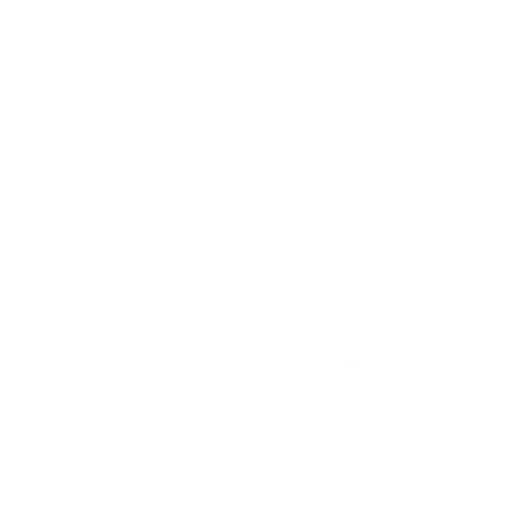

[Series 608: pet sagittal · 2 of 142 slices shown]
[im 1/142]
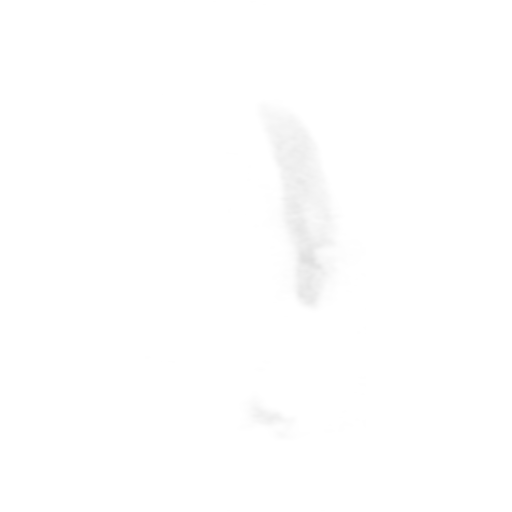
[im 142/142]
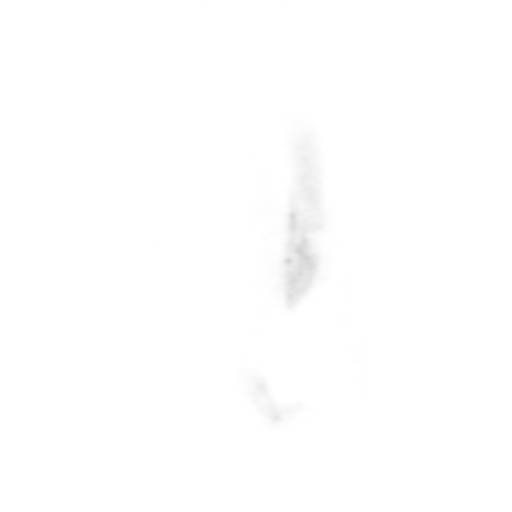

[Series 1074: results mm oncology reading · 5.0mm · 0.75mm/px · 1 of 1 slices shown]
[im 1/1]
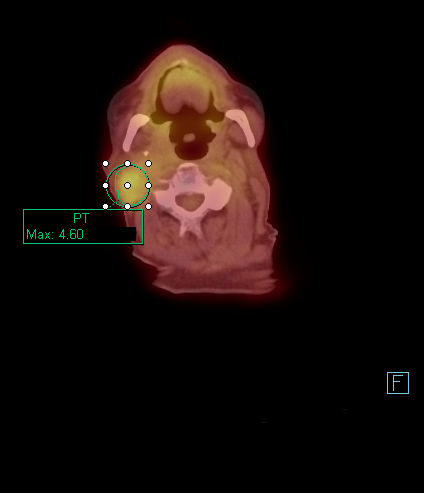

[24 of 25 positions shown; findings below may reference images not displayed]

FINDINGS: NECK

Right level II a lymph node measures 2.1 cm in short axis with
maximum standard uptake value 4.6, previously 5.2. The node appears
larger than it did on the prior PET-CT, but also centrally necrotic.

Accentuated activity along the lower cervical spinous processes,
likely degenerative.

CHEST

No hypermetabolic mediastinal or hilar nodes. No suspicious
pulmonary nodules on the CT data.

Atherosclerotic calcification of the aortic arch and left anterior
descending and right coronary arteries.

A 6 by 5 mm right lower lobe pulmonary nodule is more clearly
densely calcified on today' s exam, compatible with old
granulomatous disease.

ABDOMEN/PELVIS

No abnormal hypermetabolic activity within the liver, pancreas,
adrenal glands, or spleen. No hypermetabolic lymph nodes in the
abdomen or pelvis.

Stable hypodense renal lesions, largest of which are clearly
photopenic, favoring cysts.

Gastrostomy tube with jejunal extension noted.

Wall thickening in the gallbladder is nonspecific but could be due
to nondistention.

Aortoiliac atherosclerotic vascular disease.

Prostatomegaly. High activity in the central apex of the prostate
gland could certainly be from FDG within the prostatic urethra.

SKELETON

No significant hypermetabolic activity in the skeleton.
IMPRESSION: 1. Centrally necrotic right level IIa lymph node in the neck,
maximum SUV 4.6, previously 5.2. This node currently measures about
2.1 cm in short axis, compatible with malignancy.
2. No findings of hypermetabolic metastatic disease to the chest,
abdomen, pelvis, or visualized skeleton.
3. Prostatomegaly. Central apical activity is nonspecific but could
certainly be from FDG in the prostatic urethra.

## 2020-04-04 DEATH — deceased
